# Patient Record
Sex: Male | Born: 1935
Health system: Southern US, Community
[De-identification: ages and names within clinical notes are randomized; demographics above are authoritative.]

## PROBLEM LIST (undated history)

## (undated) DIAGNOSIS — C801 Malignant (primary) neoplasm, unspecified: Secondary | ICD-10-CM

## (undated) DIAGNOSIS — F0390 Unspecified dementia without behavioral disturbance: Secondary | ICD-10-CM

## (undated) DIAGNOSIS — N189 Chronic kidney disease, unspecified: Secondary | ICD-10-CM

## (undated) DIAGNOSIS — R296 Repeated falls: Secondary | ICD-10-CM

## (undated) DIAGNOSIS — R002 Palpitations: Secondary | ICD-10-CM

## (undated) DIAGNOSIS — I4891 Unspecified atrial fibrillation: Secondary | ICD-10-CM

## (undated) DIAGNOSIS — I10 Essential (primary) hypertension: Secondary | ICD-10-CM

## (undated) DIAGNOSIS — J209 Acute bronchitis, unspecified: Secondary | ICD-10-CM

## (undated) HISTORY — PX: CAROTID BODY TUMOR EXCISION: SHX5156

## (undated) HISTORY — PX: KNEE SURGERY: SHX244

## (undated) HISTORY — DX: Unspecified dementia, unspecified severity, without behavioral disturbance, psychotic disturbance, mood disturbance, and anxiety: F03.90

## (undated) HISTORY — DX: Acute bronchitis, unspecified: J20.9

## (undated) HISTORY — DX: Repeated falls: R29.6

## (undated) HISTORY — DX: Unspecified atrial fibrillation: I48.91

## (undated) HISTORY — PX: RECURRENT HERNIA: SHX6046

## (undated) HISTORY — DX: Chronic kidney disease, unspecified: N18.9

## (undated) HISTORY — DX: Palpitations: R00.2

---

## 2015-11-08 ENCOUNTER — Ambulatory Visit: Payer: Self-pay | Admitting: Adult Health

## 2015-12-09 ENCOUNTER — Encounter: Payer: Self-pay | Admitting: Cardiology

## 2015-12-11 ENCOUNTER — Institutional Professional Consult (permissible substitution): Payer: Self-pay | Admitting: Cardiology

## 2015-12-24 ENCOUNTER — Institutional Professional Consult (permissible substitution): Payer: Self-pay | Admitting: Cardiology

## 2015-12-24 NOTE — Progress Notes (Deleted)
Electrophysiology Office Note   Date:  12/24/2015   ID:  Jesse Benjamin, DOB [birth year], MRN [id number]  PCP:  No primary care provider on file.  Cardiologist:  *** Primary Electrophysiologist: *** Kemontae Dunklee Meredith Leeds, MD    No chief complaint on file.    History of Present Illness: Jesse Benjamin is a 82 y.o. male who presents today for electrophysiology evaluation.   ***   Today, he denies*** symptoms of palpitations, chest pain, shortness of breath, orthopnea, PND, lower extremity edema, claudication, dizziness, presyncope, syncope, bleeding, or neurologic sequela. The patient is tolerating medications without difficulties and is otherwise without complaint today.    Past Medical History:  Diagnosis Date  . Atrial fibrillation ([place])   . Bronchitis, acute   . CKD (chronic kidney disease)   . Dementia   . Frequent falls   . Palpitations    Past Surgical History:  Procedure Laterality Date  . CAROTID BODY TUMOR EXCISION    . KNEE SURGERY Right   . RECURRENT HERNIA     x 2     Current Outpatient Prescriptions  Medication Sig Dispense Refill  . lisinopril-hydrochlorothiazide (PRINZIDE,ZESTORETIC) 20-25 MG tablet Take 1 tablet by mouth daily.    Marland Kitchen omeprazole (PRILOSEC) 20 MG capsule Take 20 mg by mouth daily.    Marland Kitchen PARoxetine (PAXIL) 10 MG tablet Take 10 mg by mouth daily.    . pravastatin (PRAVACHOL) 10 MG tablet Take 10 mg by mouth daily.     No current facility-administered medications for this visit.     Allergies:   Review of patient's allergies indicates no known allergies.   Social History:  The patient  reports that he has never smoked. He has never used smokeless tobacco. He reports that he does not drink alcohol or use drugs.   Family History:  The patient's ***family history is not on file.    ROS:  Please see the history of present illness.   Otherwise, review of systems is positive for ***.   All other systems are reviewed and negative.    PHYSICAL  EXAM: VS:  There were no vitals taken for this visit. , BMI There is no height or weight on file to calculate BMI. GEN: Well nourished, well developed, in no acute distress HEENT: normal Neck: no JVD, carotid bruits, or masses Cardiac: ***RRR; no murmurs, rubs, or gallops,no edema  Respiratory:  clear to auscultation bilaterally, normal work of breathing GI: soft, nontender, nondistended, + BS MS: no deformity or atrophy Skin: warm and dry, *** device pocket is well healed Neuro:  Strength and sensation are intact Psych: euthymic mood, full affect  EKG:  EKG {ACTION; IS/IS GI:[id number] ordered today. Personal review of the ekg ordered shows ***  *** Device interrogation is reviewed today in detail.  See PaceArt for details.   Recent Labs: No results found for requested labs within last 8760 hours.    Lipid Panel  No results found for: CHOL, TRIG, HDL, CHOLHDL, VLDL, LDLCALC, LDLDIRECT   Wt Readings from Last 3 Encounters:  No data found for Wt      Other studies Reviewed: Additional studies/ records that were reviewed today include: ***  Review of the above records today demonstrates: ***   ASSESSMENT AND PLAN:  1.  ***    Current medicines are reviewed at length with the patient today.   The patient {ACTIONS; HAS/DOES NOT HAVE:19233} concerns regarding his medicines.  The following changes were made today:  {NONE DEFAULTED:18576::"none"}  Labs/  tests ordered today include: *** No orders of the defined types were placed in this encounter.    Disposition:   FU with *** {gen number [id number] {Days to years:10300}  Signed, Leeland Lovelady Meredith Leeds, MD  12/24/2015 7:44 AM     [facility] [address] [place] [address] [phone] (office) [phone] (fax)

## 2016-01-20 ENCOUNTER — Emergency Department (HOSPITAL_COMMUNITY): Payer: Medicare Other

## 2016-01-20 ENCOUNTER — Encounter (HOSPITAL_COMMUNITY): Payer: Self-pay

## 2016-01-20 ENCOUNTER — Emergency Department (HOSPITAL_COMMUNITY)
Admission: EM | Admit: 2016-01-20 | Discharge: 2016-01-20 | Disposition: A | Discharge status: Home / Self Care | Payer: Medicare Other | Attending: Emergency Medicine | Admitting: Emergency Medicine

## 2016-01-20 DIAGNOSIS — S199XXA Unspecified injury of neck, initial encounter: Secondary | ICD-10-CM | POA: Diagnosis not present

## 2016-01-20 DIAGNOSIS — S0031XA Abrasion of nose, initial encounter: Secondary | ICD-10-CM | POA: Diagnosis not present

## 2016-01-20 DIAGNOSIS — Y92009 Unspecified place in unspecified non-institutional (private) residence as the place of occurrence of the external cause: Secondary | ICD-10-CM | POA: Insufficient documentation

## 2016-01-20 DIAGNOSIS — I129 Hypertensive chronic kidney disease with stage 1 through stage 4 chronic kidney disease, or unspecified chronic kidney disease: Secondary | ICD-10-CM | POA: Diagnosis not present

## 2016-01-20 DIAGNOSIS — N189 Chronic kidney disease, unspecified: Secondary | ICD-10-CM | POA: Diagnosis not present

## 2016-01-20 DIAGNOSIS — T07XXXA Unspecified multiple injuries, initial encounter: Secondary | ICD-10-CM

## 2016-01-20 DIAGNOSIS — S0083XA Contusion of other part of head, initial encounter: Secondary | ICD-10-CM | POA: Diagnosis not present

## 2016-01-20 DIAGNOSIS — M5030 Other cervical disc degeneration, unspecified cervical region: Secondary | ICD-10-CM | POA: Diagnosis not present

## 2016-01-20 DIAGNOSIS — Z79899 Other long term (current) drug therapy: Secondary | ICD-10-CM | POA: Diagnosis not present

## 2016-01-20 DIAGNOSIS — Y990 Civilian activity done for income or pay: Secondary | ICD-10-CM | POA: Insufficient documentation

## 2016-01-20 DIAGNOSIS — Z859 Personal history of malignant neoplasm, unspecified: Secondary | ICD-10-CM | POA: Diagnosis not present

## 2016-01-20 DIAGNOSIS — S0990XA Unspecified injury of head, initial encounter: Secondary | ICD-10-CM

## 2016-01-20 DIAGNOSIS — Y9389 Activity, other specified: Secondary | ICD-10-CM | POA: Insufficient documentation

## 2016-01-20 DIAGNOSIS — W1809XA Striking against other object with subsequent fall, initial encounter: Secondary | ICD-10-CM | POA: Diagnosis not present

## 2016-01-20 DIAGNOSIS — W19XXXA Unspecified fall, initial encounter: Secondary | ICD-10-CM

## 2016-01-20 DIAGNOSIS — M47812 Spondylosis without myelopathy or radiculopathy, cervical region: Secondary | ICD-10-CM

## 2016-01-20 HISTORY — DX: Essential (primary) hypertension: I10

## 2016-01-20 HISTORY — DX: Malignant (primary) neoplasm, unspecified: C80.1

## 2016-01-20 LAB — BASIC METABOLIC PANEL
Anion gap: 8 (ref 5–15)
BUN: 23 mg/dL — AB (ref 6–20)
CALCIUM: 9.5 mg/dL (ref 8.9–10.3)
CO2: 27 mmol/L (ref 22–32)
CREATININE: 1.38 mg/dL — AB (ref 0.61–1.24)
Chloride: 102 mmol/L (ref 101–111)
GFR calc Af Amer: 49 mL/min — ABNORMAL LOW (ref >60)
GFR calc non Af Amer: 42 mL/min — ABNORMAL LOW (ref >60)
GLUCOSE: 143 mg/dL — AB (ref 65–99)
Potassium: 4.3 mmol/L (ref 3.5–5.1)
Sodium: 137 mmol/L (ref 135–145)

## 2016-01-20 LAB — URINALYSIS, ROUTINE W REFLEX MICROSCOPIC
BILIRUBIN URINE: NEGATIVE
Glucose, UA: NEGATIVE mg/dL
HGB URINE DIPSTICK: NEGATIVE
KETONES UR: NEGATIVE mg/dL
Leukocytes, UA: NEGATIVE
NITRITE: NEGATIVE
PROTEIN: NEGATIVE mg/dL
Specific Gravity, Urine: 1.02 (ref 1.005–1.030)
pH: 5.5 (ref 5.0–8.0)

## 2016-01-20 LAB — CBC WITH DIFFERENTIAL/PLATELET
BASOS PCT: 1 %
Basophils Absolute: 0.1 10*3/uL (ref 0.0–0.1)
EOS ABS: 0.2 10*3/uL (ref 0.0–0.7)
Eosinophils Relative: 1 %
HEMATOCRIT: 40.6 % (ref 39.0–52.0)
Hemoglobin: 13.7 g/dL (ref 13.0–17.0)
Lymphocytes Relative: 15 %
Lymphs Abs: 2 10*3/uL (ref 0.7–4.0)
MCH: 33.5 pg (ref 26.0–34.0)
MCHC: 33.7 g/dL (ref 30.0–36.0)
MCV: 99.3 fL (ref 78.0–100.0)
MONO ABS: 1.7 10*3/uL — AB (ref 0.1–1.0)
MONOS PCT: 12 %
NEUTROS ABS: 10 10*3/uL — AB (ref 1.7–7.7)
Neutrophils Relative %: 71 %
PLATELETS: 207 10*3/uL (ref 150–400)
RBC: 4.09 MIL/uL — ABNORMAL LOW (ref 4.22–5.81)
RDW: 14.3 % (ref 11.5–15.5)
WBC: 14 10*3/uL — ABNORMAL HIGH (ref 4.0–10.5)

## 2016-01-20 NOTE — Progress Notes (Signed)
Referral to [facility] sent via email

## 2016-01-20 NOTE — ED Notes (Signed)
C-COLLAR APPLIED BY ANNIEAH NT AND WRITER. EDP WENTZ AWARE. PT TOLERATED

## 2016-01-20 NOTE — ED Notes (Signed)
Patient transported to CT 

## 2016-01-20 NOTE — ED Provider Notes (Signed)
[place] DEPT Provider Note   CSN: [id number] Arrival date & time: 01/20/16  1601     History   Chief Complaint Chief Complaint  Patient presents with  . Fall  . Neck Pain  . Shoulder Pain    HPI Rahmel Hasbrouck is a 82 y.o. male.  He was walking up some steps and fell forward striking his nose and injuring his neck. Bystanders assisted him, to stand. He presents by EMS, without immobilization. He does not believe that he was knocked out. He was unable to get up afterwards, on his own. According to his daughters were here with him. He has had multiple falls in the last couple of years, but none recently. He lives with one of his daughters. There've been no recent illnesses such as fevers, vomiting, diarrhea, cough or chest pain. Patient does not complain of any other problems. He was recently diagnosed with atrial fibrillation, but no active treatment is planned. He is not on anticoagulants. There are no other known modifying factors.  HPI  Past Medical History:  Diagnosis Date  . Atrial fibrillation ([place])   . Bronchitis, acute   . Cancer ([place])   . CKD (chronic kidney disease)   . Dementia   . Frequent falls   . Hypertension   . Palpitations     There are no active problems to display for this patient.   Past Surgical History:  Procedure Laterality Date  . CAROTID BODY TUMOR EXCISION    . KNEE SURGERY Right   . RECURRENT HERNIA     x 2       Home Medications    Prior to Admission medications   Medication Sig Start Date End Date Taking? Authorizing Provider  lisinopril-hydrochlorothiazide (PRINZIDE,ZESTORETIC) 20-25 MG tablet Take 1 tablet by mouth daily.   Yes Historical Provider, MD  Multiple Vitamin (MULTIVITAMIN WITH MINERALS) TABS tablet Take 1 tablet by mouth daily.   Yes Historical Provider, MD  omeprazole (PRILOSEC) 20 MG capsule Take 20 mg by mouth daily.   Yes Historical Provider, MD  PARoxetine (PAXIL) 10 MG tablet Take 10 mg by mouth daily.   Yes  Historical Provider, MD  pravastatin (PRAVACHOL) 10 MG tablet Take 10 mg by mouth daily.   Yes Historical Provider, MD    Family History No family history on file.  Social History Social History  Substance Use Topics  . Smoking status: Never Smoker  . Smokeless tobacco: Never Used  . Alcohol use No     Allergies   Review of patient's allergies indicates no known allergies.   Review of Systems Review of Systems  All other systems reviewed and are negative.    Physical Exam Updated Vital Signs BP 140/91 (BP Location: Right Arm)   Pulse 105   Resp 20   SpO2 96%   Physical Exam  Constitutional: He is oriented to person, place, and time. He appears well-developed and well-nourished.  Elderly, frail  HENT:  Head: Normocephalic and atraumatic.  Right Ear: External ear normal.  Left Ear: External ear normal.  Small abrasion bridge of nose, no associated swelling or deformity. Mild swelling left upper lip, without associated laceration, abrasion or dental injury.  Eyes: Conjunctivae and EOM are normal. Pupils are equal, round, and reactive to light.  Neck: Normal range of motion and phonation normal. Neck supple.  Cardiovascular: Normal rate, regular rhythm and normal heart sounds.   Pulmonary/Chest: Effort normal and breath sounds normal. He exhibits no bony tenderness.  Abdominal: Soft. There  is no tenderness.  Musculoskeletal: Normal range of motion.  She resists flexion of the neck secondary to pain. There is no bruising or swelling of the anterior neck. There is no tenderness with palpation of the cervical, thoracic or lumbar spines. Normal range of motion, arms and legs bilaterally.  Neurological: He is alert and oriented to person, place, and time. No cranial nerve deficit or sensory deficit. He exhibits normal muscle tone. Coordination normal.  Skin: Skin is warm, dry and intact.  Psychiatric: He has a normal mood and affect. His behavior is normal.  Nursing note and  vitals reviewed.    ED Treatments / Results  Labs (all labs ordered are listed, but only abnormal results are displayed) Labs Reviewed  BASIC METABOLIC PANEL - Abnormal; Notable for the following:       Result Value   Glucose, Bld 143 (*)    BUN 23 (*)    Creatinine, Ser 1.38 (*)    GFR calc non Af Amer 42 (*)    GFR calc Af Amer 49 (*)    All other components within normal limits  CBC WITH DIFFERENTIAL/PLATELET - Abnormal; Notable for the following:    WBC 14.0 (*)    RBC 4.09 (*)    Neutro Abs 10.0 (*)    Monocytes Absolute 1.7 (*)    All other components within normal limits  URINALYSIS, ROUTINE W REFLEX MICROSCOPIC (NOT AT [facility])    EKG  EKG Interpretation  Date/Time:  Monday January 20 2016 16:51:30 EDT Ventricular Rate:  90 PR Interval:    QRS Duration: 89 QT Interval:  396 QTC Calculation: 485 R Axis:   61 Text Interpretation:  Atrial fibrillation Ventricular bigeminy Anterior infarct, old No old tracing to compare Confirmed by [facility]  MD, Hargis Vandyne [phone]) on 01/20/2016 6:38:40 PM       Radiology Dg Chest 2 View  Result Date: 01/20/2016 CLINICAL DATA:  Patient found down today. EXAM: CHEST  2 VIEW COMPARISON:  None. FINDINGS: Subsegmental atelectasis is seen in the left lung base. Lungs are otherwise clear. There is cardiomegaly. Aortic atherosclerosis is noted. No pneumothorax or effusion. No focal bony abnormality. IMPRESSION: Cardiomegaly without acute disease. Atherosclerosis. Electronically Signed   By: Inge Rise M.D.   On: 01/20/2016 17:38   Ct Head Wo Contrast  Result Date: 01/20/2016 CLINICAL DATA:  82 year old male with unwitnessed fall. Left side pain. Initial encounter. EXAM: CT HEAD WITHOUT CONTRAST CT CERVICAL SPINE WITHOUT CONTRAST TECHNIQUE: Multidetector CT imaging of the head and cervical spine was performed following the standard protocol without intravenous contrast. Multiplanar CT image reconstructions of the cervical spine were also  generated. COMPARISON:  None. FINDINGS: CT HEAD FINDINGS Brain: No midline shift, ventriculomegaly, mass effect, evidence of mass lesion, intracranial hemorrhage or evidence of cortically based acute infarction. Small area of left inferior frontal gyrus encephalomalacia, likely the sequelae of remote trauma. No other cortical encephalomalacia, and elsewhere gray-white matter differentiation is normal for age. Vascular: Calcified atherosclerosis at the skull base. No suspicious intracranial vascular hyperdensity. Skull: Calvarium intact. Sinuses/Orbits: Clear. Other: Mild left forehead scalp hematoma (series 4, image 18). No other acute scalp or orbits soft tissue findings. CT CERVICAL SPINE FINDINGS Alignment: Multilevel mild spondylolisthesis, including anterolisthesis of C4 on C5 and C5 on C6. Straightening and mild reversal of cervical lordosis. Cervicothoracic junction alignment is within normal limits. Bilateral posterior element alignment is within normal limits. Skull base and vertebrae: Visualized skull base is intact. No atlanto-occipital dissociation. No acute cervical spine  fracture identified. Soft tissues and spinal canal: No prevertebral fluid or swelling. No visible canal hematoma. Calcified carotid atherosclerosis. Otherwise negative noncontrast neck soft tissues. Disc levels: Moderate to severe cervical facet degeneration C3-C4 and C4-C5. Advanced chronic disc and endplate degeneration D34-534 and C6-C7. No significant spinal stenosis suspected. Upper chest: Negative lung apices. Chronic appearing T2 inferior endplate deformity. Other visible upper thoracic levels appear intact. Other: None. IMPRESSION: 1. Left forehead scalp hematoma without underlying fracture. 2. No acute traumatic injury to the brain is identified. Mild chronic left inferior frontal gyrus encephalomalacia is likely the sequelae of remote trauma. 3. Otherwise negative for age noncontrast CT appearance of the brain. 4. No acute  fracture identified in the cervical spine. Ligamentous injury is not excluded. 5. Multilevel degenerative appearing mild spondylolisthesis with advanced disc, endplate and facet degeneration. Electronically Signed   By: Genevie Ann M.D.   On: 01/20/2016 17:34   Ct Cervical Spine Wo Contrast  Result Date: 01/20/2016 CLINICAL DATA:  82 year old male with unwitnessed fall. Left side pain. Initial encounter. EXAM: CT HEAD WITHOUT CONTRAST CT CERVICAL SPINE WITHOUT CONTRAST TECHNIQUE: Multidetector CT imaging of the head and cervical spine was performed following the standard protocol without intravenous contrast. Multiplanar CT image reconstructions of the cervical spine were also generated. COMPARISON:  None. FINDINGS: CT HEAD FINDINGS Brain: No midline shift, ventriculomegaly, mass effect, evidence of mass lesion, intracranial hemorrhage or evidence of cortically based acute infarction. Small area of left inferior frontal gyrus encephalomalacia, likely the sequelae of remote trauma. No other cortical encephalomalacia, and elsewhere gray-white matter differentiation is normal for age. Vascular: Calcified atherosclerosis at the skull base. No suspicious intracranial vascular hyperdensity. Skull: Calvarium intact. Sinuses/Orbits: Clear. Other: Mild left forehead scalp hematoma (series 4, image 18). No other acute scalp or orbits soft tissue findings. CT CERVICAL SPINE FINDINGS Alignment: Multilevel mild spondylolisthesis, including anterolisthesis of C4 on C5 and C5 on C6. Straightening and mild reversal of cervical lordosis. Cervicothoracic junction alignment is within normal limits. Bilateral posterior element alignment is within normal limits. Skull base and vertebrae: Visualized skull base is intact. No atlanto-occipital dissociation. No acute cervical spine fracture identified. Soft tissues and spinal canal: No prevertebral fluid or swelling. No visible canal hematoma. Calcified carotid atherosclerosis. Otherwise  negative noncontrast neck soft tissues. Disc levels: Moderate to severe cervical facet degeneration C3-C4 and C4-C5. Advanced chronic disc and endplate degeneration D34-534 and C6-C7. No significant spinal stenosis suspected. Upper chest: Negative lung apices. Chronic appearing T2 inferior endplate deformity. Other visible upper thoracic levels appear intact. Other: None. IMPRESSION: 1. Left forehead scalp hematoma without underlying fracture. 2. No acute traumatic injury to the brain is identified. Mild chronic left inferior frontal gyrus encephalomalacia is likely the sequelae of remote trauma. 3. Otherwise negative for age noncontrast CT appearance of the brain. 4. No acute fracture identified in the cervical spine. Ligamentous injury is not excluded. 5. Multilevel degenerative appearing mild spondylolisthesis with advanced disc, endplate and facet degeneration. Electronically Signed   By: Genevie Ann M.D.   On: 01/20/2016 17:34    Procedures Procedures (including critical care time)  Medications Ordered in ED Medications - No data to display   Initial Impression / Assessment and Plan / ED Course  I have reviewed the triage vital signs and the nursing notes.  Pertinent labs & imaging results that were available during my care of the patient were reviewed by me and considered in my medical decision making (see chart for details).  Clinical Course  Medications - No data to display  Patient Vitals for the past 24 hrs:  BP Pulse Resp SpO2  01/20/16 1845 140/91 105 20 96 %  01/20/16 1612 - - - 97 %   Patient was seen in the ED, by both case manager, and Education officer, museum. They gave resources to the family members to assist with additional care and considerations for placement.  7:39 PM Reevaluation with update and discussion. After initial assessment and treatment, an updated evaluation reveals He remains alert and comfortable. He was able to ambulate easily, and stated to be normal, by his  daughter. Findings discussed with patient and family members, all questions were answered. Ersie Savino L    Final Clinical Impressions(s) / ED Diagnoses   Final diagnoses:  Fall, initial encounter  Injury of head, initial encounter  Injury of neck, initial encounter  DJD (degenerative joint disease), cervical  Abrasion of nose, initial encounter  Contusion, multiple sites    Fall, likely mechanical, with negative screening evaluation, for causes of falling. CT his head and neck, negative for serious injury. Incidental osteoarthritis, family members informed.  Nursing Notes Reviewed/ Care Coordinated Applicable Imaging Reviewed Interpretation of Laboratory Data incorporated into ED treatment  The patient appears reasonably screened and/or stabilized for discharge and I doubt any other medical condition or other [facility] requiring further screening, evaluation, or treatment in the ED at this time prior to discharge.  Plan: Home Medications- APAP for pain, continue usual; Home Treatments- rest, wound care, ice/heat; return here if the recommended treatment, does not improve the symptoms; Recommended follow up- PCP 1 week. Consider further w/u, PT, placement    New Prescriptions New Prescriptions   No medications on file     Daleen Bo, MD 01/20/16 1942

## 2016-01-20 NOTE — ED Triage Notes (Signed)
Per [facility]- Pt resides at home- FULL CODE.Unwitnessed fall. Found by his neighbor. Pt able to recall fall. He states he lost his balance going down steps landing in a bush. Pt c/o anterior left sided neck pain and left sided clavicle pain. NO LOC. Denies N/V/D CP/SOB. Neuro intact. PEERL. No spinal support SCCA cleared

## 2016-01-20 NOTE — Discharge Instructions (Signed)
Keep the nose wound clean with soap and water once or twice a day.  Use ice on sore spots 3 times a day for 3 days. After that, use heat.  For pain, take Tylenol.

## 2016-01-20 NOTE — Progress Notes (Addendum)
Pcp is [facility]row Pt with 2 daughters Was living with the daughter with the single level home when he had his fall Pt without his hearing aid and with difficulty hearing Very Pleasant Pt gave Cm a hug  Daughter, Vaughan Basta, and son in law in the room was the family pt stayed with when he first moved to the area a few months ago. The daughter present in his room walked out of the room when the CM was leaving & discussed with ED CM that she feels he should be in a nursing home Reports she is working with realtors to sell the pt home. Pt has not informed CM he wanted to go to facility.  Daughter at bedside reports the death of pt wife "two years ago" and the death of a brother in [place] last year.   CM discussed [facility] connections as a resource for pt that the family could access to get options of disposition for this medicare

## 2016-01-20 NOTE — ED Notes (Signed)
ANNIEAH NT WALKED PT WITH FAMILY TO BATHROOM. AWARE OF NEED FOR URINE

## 2016-01-20 NOTE — ED Notes (Signed)
Pt. Unable to urinate at this time. Nurse aware. 

## 2016-01-20 NOTE — Progress Notes (Addendum)
CM reviewed in details medicare guidelines, Choices of home health [facility]) (length of stay in home, types of [facility] staff available, coverage, primary caregiver, up to 24 hrs before services may be started) and choices of Private duty nursing (PDN-coverage, length of stay in the home types of staff available). CM reviewed availability of [facility] SW to assist pcp to get pt to snf (if desired disposition) from the community level. CM provided pt/family with a copy of choice connections, a list of [place] home health agencies and PDN. Encouraged use of choice connections Updated EDP, Eulis Foster who reviewed clinicals -no reason for admission identified   Discussed assisted living (ASL- coverage, services offered) and Skilled nursing facilities (snf- coverage and services offered)  Cm spoke with ED SW to see if Vaughan Basta could be given a list of ALFs and SNFs Pt with walker, cane, crutch at daughters home stayed with Vaughan Basta and husband for 2 months and now with other sister for 2 months Pt last fall was in April 2017 per Vaughan Basta but with noted frequent falls Vaughan Basta also reports pt had HHPT, RN and sitter in [place] but eventually would not allow staff in to visit him.   Pt with a hx of dementia and Vaughan Basta had informed pt they would find him an apartment CM encouraged a conversation about facility placement vs apartment Discussed neurology evaluation  ED CM left her office number with Vaughan Basta

## 2016-01-20 NOTE — ED Notes (Signed)
Bed: [id number] Expected date:  Expected time:  Means of arrival:  Comments: 82 yo M, Fall

## 2016-01-20 NOTE — Progress Notes (Signed)
CSW provided patients daughter, Vaughan Basta, with list of skilled nursing facility list and assisted living facility list.   Kingsley Spittle, [facility] Clinical Social Worker [phone]

## 2016-01-21 ENCOUNTER — Telehealth: Payer: Self-pay | Admitting: *Deleted

## 2016-10-28 ENCOUNTER — Ambulatory Visit (INDEPENDENT_AMBULATORY_CARE_PROVIDER_SITE_OTHER): Payer: Medicare Other | Admitting: Podiatry

## 2016-10-28 ENCOUNTER — Encounter: Payer: Self-pay | Admitting: Podiatry

## 2016-10-28 DIAGNOSIS — B351 Tinea unguium: Secondary | ICD-10-CM

## 2016-10-28 DIAGNOSIS — M79676 Pain in unspecified toe(s): Secondary | ICD-10-CM | POA: Diagnosis not present

## 2016-10-28 NOTE — Progress Notes (Signed)
° °  Subjective:    Patient ID: Jesse Benjamin, male    DOB: April 29, 1920, 83 y.o.   MRN: [id number]  HPI    Review of Systems  HENT: Positive for hearing loss.        Wears hearing aids  Cardiovascular: Positive for leg swelling.       Feet  Endocrine: Positive for polyuria.  Genitourinary: Positive for frequency and urgency.  Musculoskeletal: Positive for gait problem.  All other systems reviewed and are negative.      Objective:   Physical Exam        Assessment & Plan:

## 2016-10-30 NOTE — Progress Notes (Signed)
Patient ID: Jesse Benjamin, male   DOB: November 24, 1920, 83 y.o.   MRN: [id number]   SUBJECTIVE Patient  presents to office today complaining of elongated, thickened nails. Pain while ambulating in shoes. Patient is unable to trim their own nails.   OBJECTIVE General Patient is awake, alert, and oriented x 3 and in no acute distress. Derm Skin is dry and supple bilateral. Negative open lesions or macerations. Remaining integument unremarkable. Nails are tender, long, thickened and dystrophic with subungual debris, consistent with onychomycosis, 1-5 bilateral. No signs of infection noted. Vasc  DP and PT pedal pulses palpable bilaterally. Temperature gradient within normal limits.  Neuro Epicritic and protective threshold sensation diminished bilaterally.  Musculoskeletal Exam No symptomatic pedal deformities noted bilateral. Muscular strength within normal limits.  ASSESSMENT 1. Onychodystrophic nails 1-5 bilateral with hyperkeratosis of nails.  2. Onychomycosis of nail due to dermatophyte bilateral 3. Pain in foot bilateral  PLAN OF CARE 1. Patient evaluated today.  2. Instructed to maintain good pedal hygiene and foot care.  3. Mechanical debridement of nails 1-5 bilaterally performed using a nail nipper. Filed with dremel without incident.  4. Return to clinic in 3 mos.    Edrick Kins, DPM [facility]  Dr. Edrick Kins, [place]                                        [place], [place] [address]                Office [phone]  Fax [phone]

## 2017-10-26 IMAGING — CR DG CHEST 2V
2 series · 2 of 2 positions shown · non-contrast
Comparison: None.

CLINICAL DATA: Patient found down today.

EXAM:
CHEST  2 VIEW

[w chest lat]
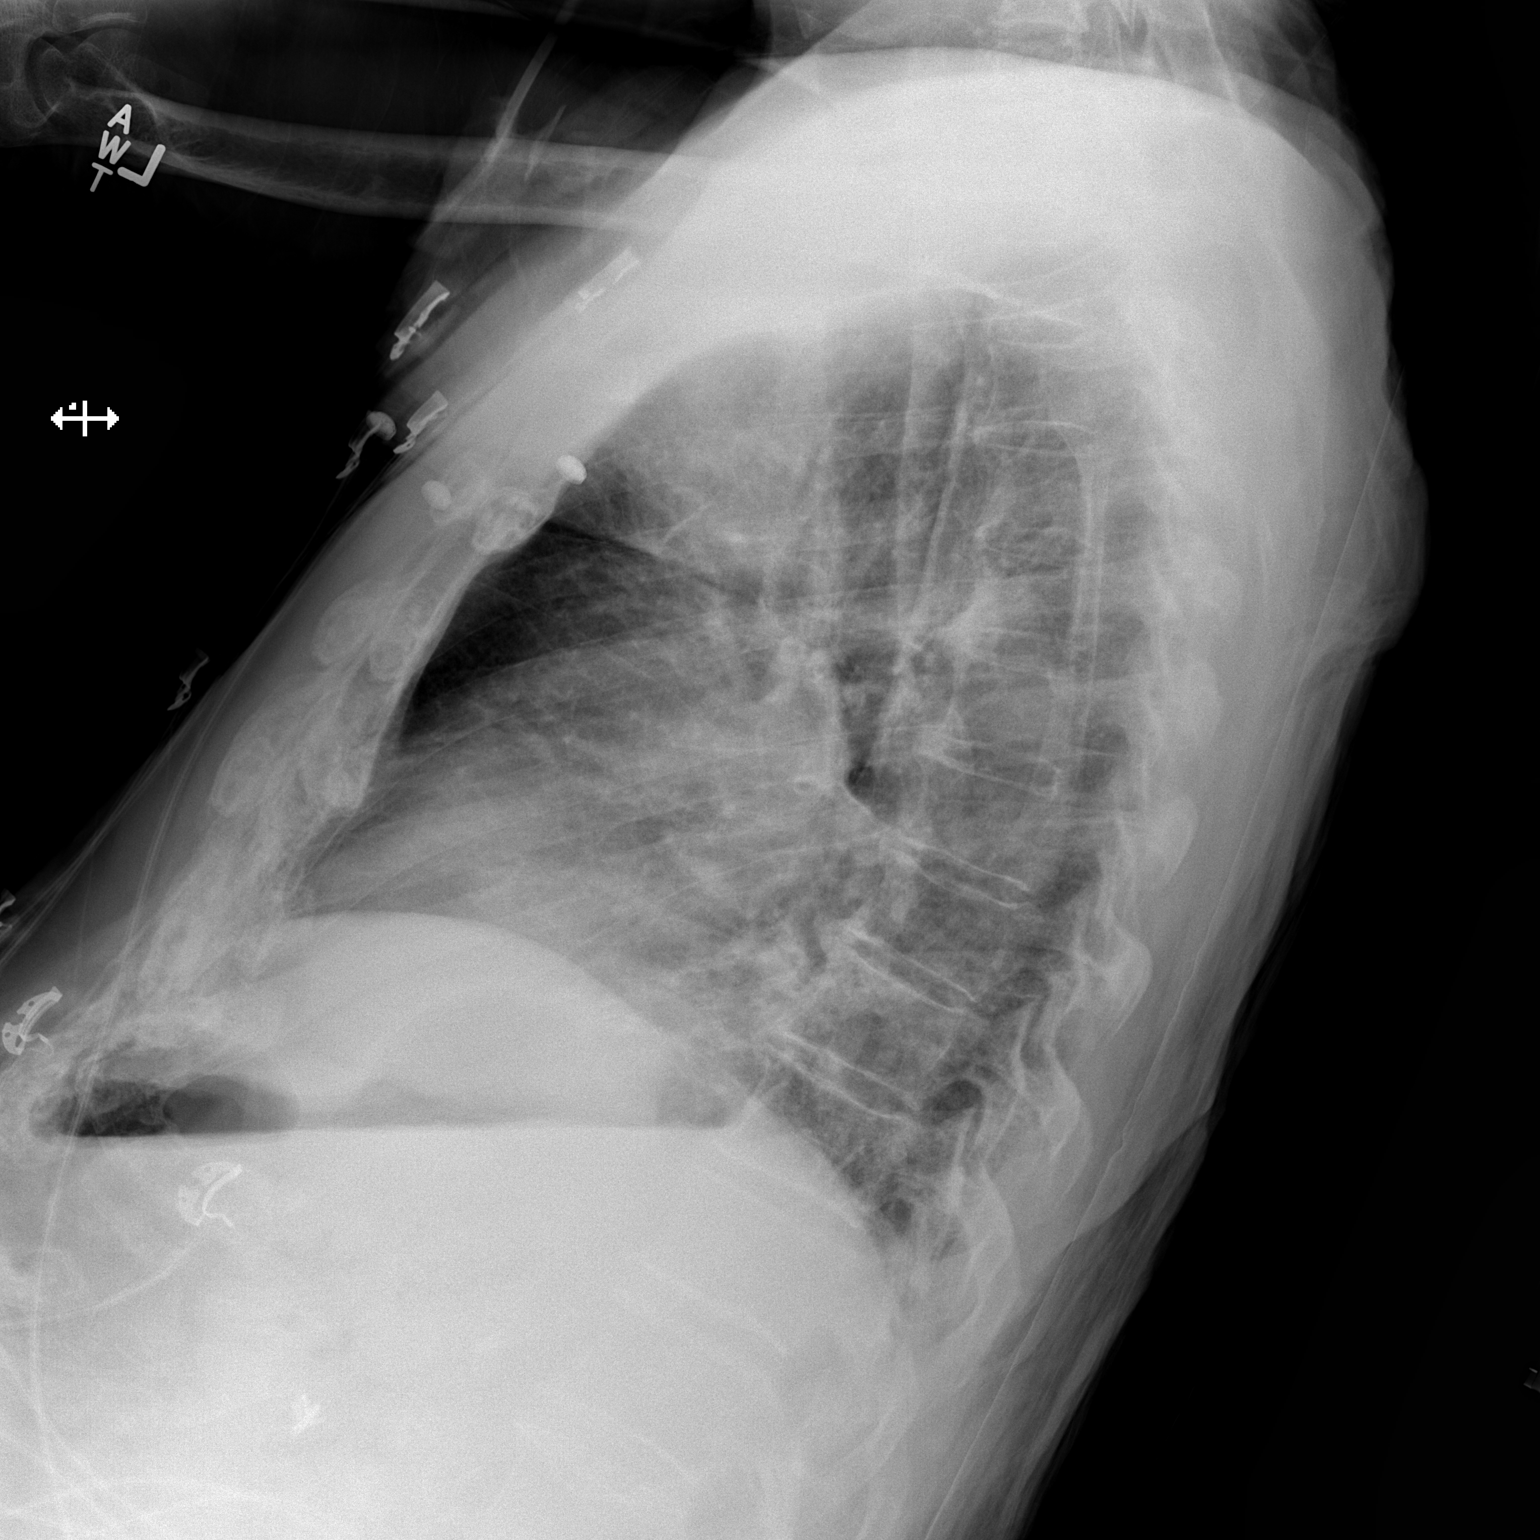

[x chest ap]
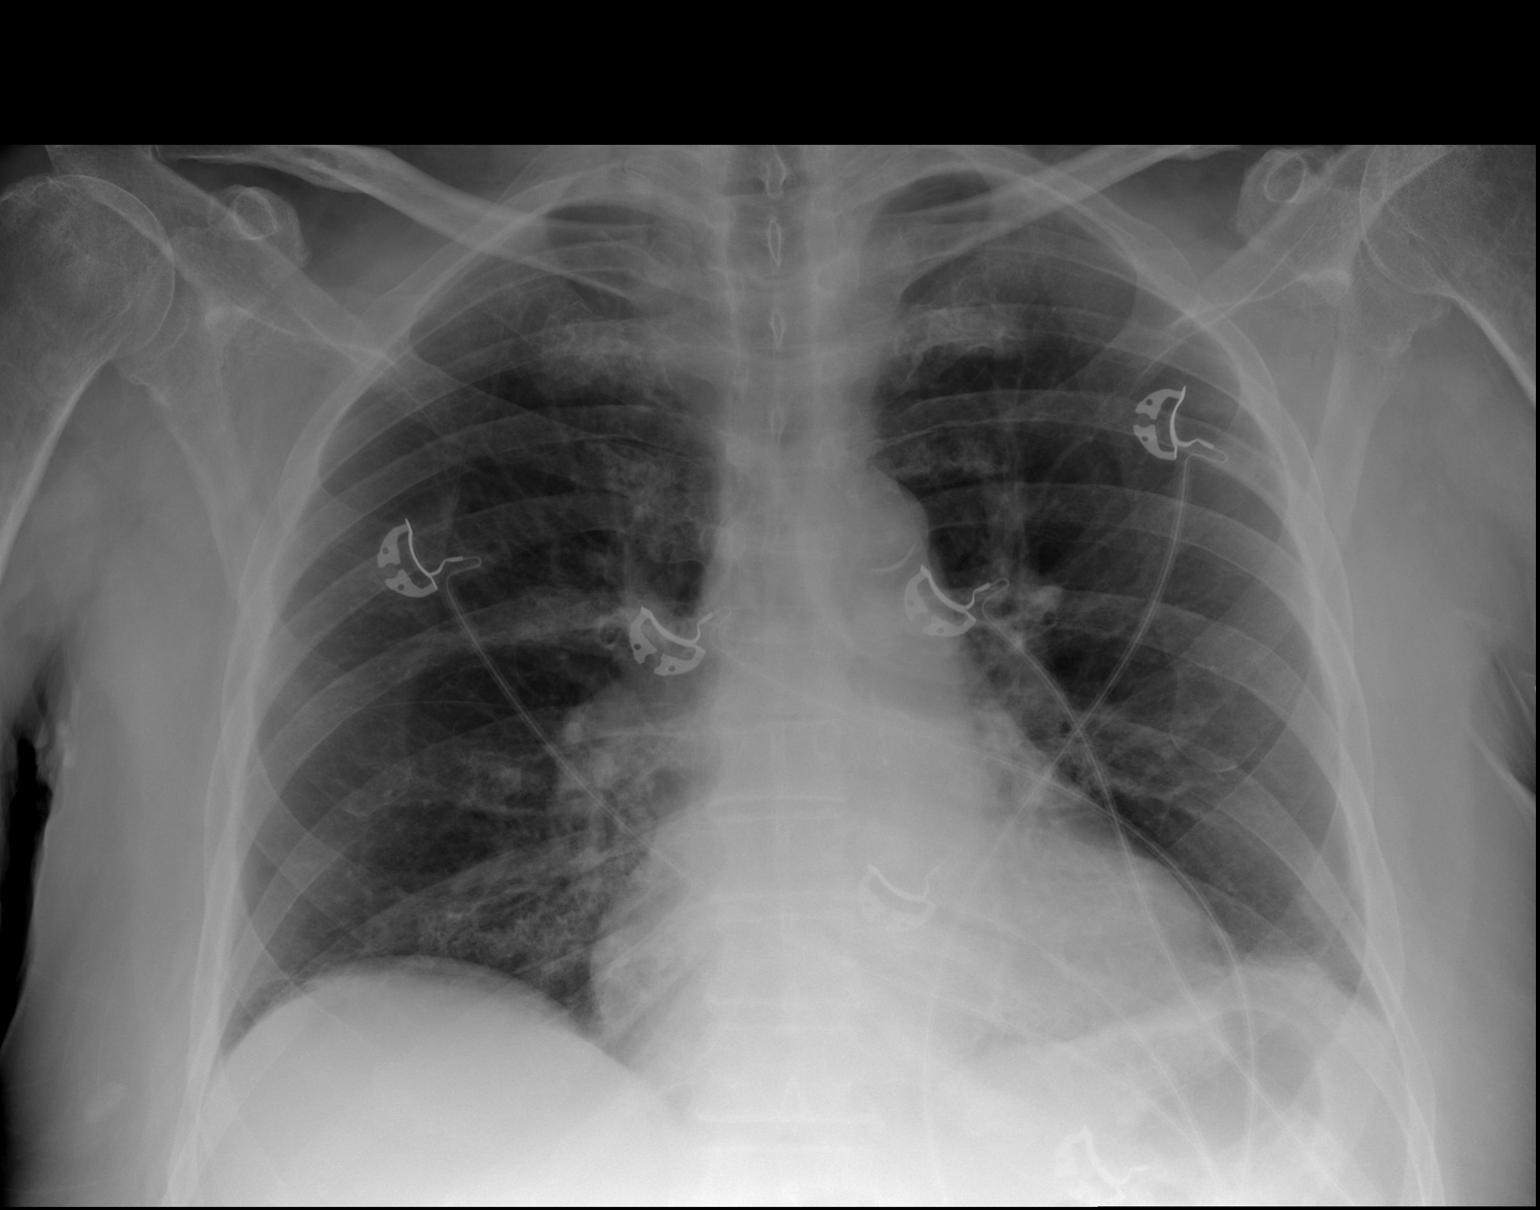

[2 of 2 positions shown; findings below may reference images not displayed]

FINDINGS: Subsegmental atelectasis is seen in the left lung base. Lungs are
otherwise clear. There is cardiomegaly. Aortic atherosclerosis is
noted. No pneumothorax or effusion. No focal bony abnormality.
IMPRESSION: Cardiomegaly without acute disease.

Atherosclerosis.

## 2020-04-30 ENCOUNTER — Ambulatory Visit: Payer: Medicare Other | Admitting: Dermatology

## 2021-02-10 NOTE — Progress Notes (Signed)
Oncology Nurse Navigator Documentation   I received notice that Jesse Benjamin daughter, Jesse Benjamin called to cancel his consult appointment with Dr. Isidore Moos tomorrow. 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 She told me that it was due to his age and they were told by his Dermatologist that is was a slower growing cancer. I will notify Dr. Margit Banda office and have told Jesse Benjamin that if they reconsider to please call me and she verbalized her understanding.   Harlow Asa RN, BSN, OCN Head & Neck Oncology Nurse [place] at [facility] Phone # [phone]  Fax # [phone]

## 2021-02-10 NOTE — Progress Notes (Incomplete)
Radiation Oncology         [phone] ________________________________  Initial Outpatient Consultation  Name: Jesse Benjamin MRN: [id number]  Date: 02/11/2021  DOB: 03-21-1921  CC:Jesse Pepper, MD  Lajuana Ripple, MD   REFERRING PHYSICIAN: Lajuana Ripple, MD  DIAGNOSIS: No diagnosis found.  Cancer Staging No matching staging information was found for the patient.  Lesion of the right ear  CHIEF COMPLAINT: Here to discuss management of skin cancer  HISTORY OF PRESENT ILLNESS::Jesse Benjamin is a 87 y.o. male who presented with a lesion on his right ear to Dr. Anabel Bene on 01/22/21. The lesion was noted as bleeding and red on exam and moderate in severity. According to the patient, the lesion had been present for many years and has not been treated in the past. The patient has a history of skin cancer.   Biopsy was collected of the ear lesion during visit on 10/12; pathology is still pending ***.    PREVIOUS RADIATION THERAPY: {EXAM; YES/NO:19492::"No"}  PAST MEDICAL HISTORY:  has a past medical history of Atrial fibrillation ([place]), Bronchitis, acute, Cancer ([place]), CKD (chronic kidney disease), Dementia, Frequent falls, Hypertension, and Palpitations.    PAST SURGICAL HISTORY: Past Surgical History:  Procedure Laterality Date   CAROTID BODY TUMOR EXCISION     KNEE SURGERY Right    RECURRENT HERNIA     x 2    FAMILY HISTORY: family history is not on file.  SOCIAL HISTORY:  reports that he has never smoked. He has never used smokeless tobacco. He reports that he does not drink alcohol and does not use drugs.  ALLERGIES: Patient has no allergy information on record.  MEDICATIONS:  Current Outpatient Medications  Medication Sig Dispense Refill   lisinopril-hydrochlorothiazide (PRINZIDE,ZESTORETIC) 20-25 MG tablet Take 1 tablet by mouth daily.     METOPROLOL SUCCINATE ER PO Take by mouth.     Multiple Vitamin (MULTIVITAMIN WITH MINERALS) TABS tablet Take 1 tablet by mouth  daily.     omeprazole (PRILOSEC) 20 MG capsule Take 20 mg by mouth daily.     PARoxetine (PAXIL) 10 MG tablet Take 10 mg by mouth daily.     pravastatin (PRAVACHOL) 10 MG tablet Take 10 mg by mouth daily.     No current facility-administered medications for this encounter.    REVIEW OF SYSTEMS:  Notable for that above.   PHYSICAL EXAM:  vitals were not taken for this visit.   General: Alert and oriented, in no acute distress *** HEENT: Head is normocephalic. Extraocular movements are intact. Oropharynx is clear. Neck: Neck is supple, no palpable cervical or supraclavicular lymphadenopathy. Heart: Regular in rate and rhythm with no murmurs, rubs, or gallops. Chest: Clear to auscultation bilaterally, with no rhonchi, wheezes, or rales. Abdomen: Soft, nontender, nondistended, with no rigidity or guarding. Extremities: No cyanosis or edema. Lymphatics: see Neck Exam Skin: No concerning lesions. Musculoskeletal: symmetric strength and muscle tone throughout. Neurologic: Cranial nerves II through XII are grossly intact. No obvious focalities. Speech is fluent. Coordination is intact. Psychiatric: Judgment and insight are intact. Affect is appropriate.   ECOG = ***  0 - Asymptomatic (Fully active, able to carry on all predisease activities without restriction)  1 - Symptomatic but completely ambulatory (Restricted in physically strenuous activity but ambulatory and able to carry out work of a light or sedentary nature. For example, light housework, office work)  2 - Symptomatic, <50% in bed during the day (Ambulatory and capable of all self care but unable  to carry out any work activities. Up and about more than 50% of waking hours)  3 - Symptomatic, >50% in bed, but not bedbound (Capable of only limited self-care, confined to bed or chair 50% or more of waking hours)  4 - Bedbound (Completely disabled. Cannot carry on any self-care. Totally confined to bed or chair)  5 - Death   Eustace Pen  MM, Creech RH, Tormey DC, et al. [phone]). "Toxicity and response criteria of the [facility]". [place] Oncol. 5 (6): 649-55   LABORATORY DATA:  Lab Results  Component Value Date   WBC 14.0 (H) 01/20/2016   HGB 13.7 01/20/2016   HCT 40.6 01/20/2016   MCV 99.3 01/20/2016   PLT 207 01/20/2016   CMP     Component Value Date/Time   NA 137 01/20/2016 1726   K 4.3 01/20/2016 1726   CL 102 01/20/2016 1726   CO2 27 01/20/2016 1726   GLUCOSE 143 (H) 01/20/2016 1726   BUN 23 (H) 01/20/2016 1726   CREATININE 1.38 (H) 01/20/2016 1726   CALCIUM 9.5 01/20/2016 1726   GFRNONAA 42 (L) 01/20/2016 1726   GFRAA 49 (L) 01/20/2016 1726         RADIOGRAPHY: No results found.    IMPRESSION/PLAN:***    On date of service, in total, I spent *** minutes on this encounter. Patient was seen in person.   __________________________________________   Eppie Gibson, MD  This document serves as a record of services personally performed by Eppie Gibson, MD. It was created on her behalf by Roney Mans, a trained medical scribe. The creation of this record is based on the scribe's personal observations and the provider's statements to them. This document has been checked and approved by the attending provider.

## 2021-02-11 ENCOUNTER — Ambulatory Visit
Admission: RE | Admit: 2021-02-11 | Discharge: 2021-02-11 | Disposition: A | Discharge status: Home / Self Care | Payer: Medicare Other | Source: Ambulatory Visit | Attending: Radiation Oncology | Admitting: Radiation Oncology

## 2021-02-11 ENCOUNTER — Ambulatory Visit: Payer: Medicare Other

## 2021-06-16 ENCOUNTER — Emergency Department (HOSPITAL_BASED_OUTPATIENT_CLINIC_OR_DEPARTMENT_OTHER): Payer: Medicare Other | Admitting: Radiology

## 2021-06-16 ENCOUNTER — Emergency Department (HOSPITAL_BASED_OUTPATIENT_CLINIC_OR_DEPARTMENT_OTHER)
Admission: EM | Admit: 2021-06-16 | Discharge: 2021-06-16 | Disposition: A | Discharge status: Home / Self Care | Payer: Medicare Other | Attending: Emergency Medicine | Admitting: Emergency Medicine

## 2021-06-16 ENCOUNTER — Encounter (HOSPITAL_BASED_OUTPATIENT_CLINIC_OR_DEPARTMENT_OTHER): Payer: Self-pay

## 2021-06-16 ENCOUNTER — Emergency Department (HOSPITAL_BASED_OUTPATIENT_CLINIC_OR_DEPARTMENT_OTHER): Payer: Medicare Other

## 2021-06-16 ENCOUNTER — Other Ambulatory Visit: Payer: Self-pay

## 2021-06-16 DIAGNOSIS — I6789 Other cerebrovascular disease: Secondary | ICD-10-CM | POA: Diagnosis not present

## 2021-06-16 DIAGNOSIS — Z23 Encounter for immunization: Secondary | ICD-10-CM | POA: Insufficient documentation

## 2021-06-16 DIAGNOSIS — S51811A Laceration without foreign body of right forearm, initial encounter: Secondary | ICD-10-CM | POA: Diagnosis not present

## 2021-06-16 DIAGNOSIS — S59911A Unspecified injury of right forearm, initial encounter: Secondary | ICD-10-CM | POA: Diagnosis present

## 2021-06-16 DIAGNOSIS — W1811XA Fall from or off toilet without subsequent striking against object, initial encounter: Secondary | ICD-10-CM | POA: Diagnosis not present

## 2021-06-16 MED ORDER — CEFAZOLIN SODIUM-DEXTROSE 1-4 GM/50ML-% IV SOLN
1.0000 g | Freq: Once | INTRAVENOUS | Status: AC
  Administered 2021-06-16: 1 g via INTRAVENOUS
  Filled 2021-06-16: qty 50

## 2021-06-16 MED ORDER — LIDOCAINE-EPINEPHRINE 2 %-1:200000 IJ SOLN
20.0000 mL | Freq: Once | INTRAMUSCULAR | Status: AC
Start: 2021-06-16 — End: 2021-06-16
  Administered 2021-06-16: 20 mL
  Filled 2021-06-16: qty 20

## 2021-06-16 MED ORDER — TETANUS-DIPHTH-ACELL PERTUSSIS 5-2.5-18.5 LF-MCG/0.5 IM SUSY
0.5000 mL | PREFILLED_SYRINGE | Freq: Once | INTRAMUSCULAR | Status: AC
Start: 2021-06-16 — End: 2021-06-16
  Administered 2021-06-16: 0.5 mL via INTRAMUSCULAR
  Filled 2021-06-16: qty 0.5

## 2021-06-16 NOTE — ED Notes (Signed)
EMT-P provided AVS using Teachback Method. Patient verbalizes understanding of Discharge Instructions. Opportunity for Questioning and Answers were provided by EMT-P. Patient Discharged from ED.  ? ?

## 2021-06-16 NOTE — ED Provider Notes (Signed)
[facility] EMERGENCY DEPT Provider Note   CSN: [id number] Arrival date & time: 06/16/21  1228     History  Chief Complaint  Patient presents with   Lytle Michaels    Jesse Benjamin is a 88 y.o. male who presents to the Emergency Department with complaints of a fall onset PTA. Pt notes that he was attempting to get off the toiler when he slid off it causing him to hit his right forearm on the nearby vanity. Pt is right hand dominant. Hasn't tried any medications for his symptoms. Denies hitting his head, LOC, dizziness, lightheadedness, vision change. Denies anticoagulant use at this time.   The history is provided by the patient and a relative. No language interpreter was used.      Home Medications Prior to Admission medications   Medication Sig Start Date End Date Taking? Authorizing Provider  lisinopril-hydrochlorothiazide (PRINZIDE,ZESTORETIC) 20-25 MG tablet Take 1 tablet by mouth daily.    [provider]  METOPROLOL SUCCINATE ER PO Take by mouth.    [provider]  Multiple Vitamin (MULTIVITAMIN WITH MINERALS) TABS tablet Take 1 tablet by mouth daily.    [provider]  omeprazole (PRILOSEC) 20 MG capsule Take 20 mg by mouth daily.    [provider]  PARoxetine (PAXIL) 10 MG tablet Take 10 mg by mouth daily.    [provider]  pravastatin (PRAVACHOL) 10 MG tablet Take 10 mg by mouth daily.    [provider]      Allergies    Patient has no allergy information on record.    Review of Systems   Review of Systems  Eyes:  Negative for visual disturbance.  Musculoskeletal:  Positive for arthralgias. Negative for joint swelling.  Skin:  Positive for wound. Negative for color change.  Neurological:  Negative for dizziness, syncope, light-headedness and headaches.  All other systems reviewed and are negative.  Physical Exam Updated Vital Signs BP (!) 143/63 (BP Location: Left Arm)    Pulse 75    Temp 98 F (36.7  C)    Resp 16    Ht 6' (1.829 m)    Wt 90.7 kg    SpO2 99%    BMI 27.12 kg/m  Physical Exam Vitals and nursing note reviewed.  Constitutional:      General: He is not in acute distress.    Appearance: He is not diaphoretic.  HENT:     Head: Normocephalic and atraumatic.     Mouth/Throat:     Pharynx: No oropharyngeal exudate.  Eyes:     General: No scleral icterus.    Conjunctiva/sclera: Conjunctivae normal.  Cardiovascular:     Rate and Rhythm: Normal rate and regular rhythm.     Pulses: Normal pulses.     Heart sounds: Normal heart sounds.  Pulmonary:     Effort: Pulmonary effort is normal. No respiratory distress.     Breath sounds: Normal breath sounds. No wheezing.  Abdominal:     General: Bowel sounds are normal.     Palpations: Abdomen is soft. There is no mass.     Tenderness: There is no abdominal tenderness. There is no guarding or rebound.  Musculoskeletal:        General: Normal range of motion.     Cervical back: Normal range of motion and neck supple.     Comments: 7 cm laceration noted to lateral aspect of right forearm. Capillary refill <2 seconds. Finger to thumb opposition intact. Grip strength 5/5 bilaterally.  Strength and sensation intact to bilateral upper and lower extremities.  Skin:    General: Skin is warm and dry.     Findings: Laceration present.  Neurological:     Mental Status: He is alert.  Psychiatric:        Behavior: Behavior normal.      ED Results / Procedures / Treatments   Labs (all labs ordered are listed, but only abnormal results are displayed) Labs Reviewed - No data to display  EKG None  Radiology DG Forearm Right  Result Date: 06/16/2021 CLINICAL DATA:  Fall, right forearm laceration EXAM: RIGHT FOREARM - 2 VIEW COMPARISON:  None. FINDINGS: No fracture. No focal osseous lesions. No bony erosions. No radiopaque foreign bodies. No dislocation at the elbow or wrist on these views. IMPRESSION: No fracture.  No radiopaque  foreign body. Electronically Signed   By: Ilona Sorrel M.D.   On: 06/16/2021 15:02   CT Head Wo Contrast  Result Date: 06/16/2021 CLINICAL DATA:  Fall. EXAM: CT HEAD WITHOUT CONTRAST TECHNIQUE: Contiguous axial images were obtained from the base of the skull through the vertex without intravenous contrast. RADIATION DOSE REDUCTION: This exam was performed according to the departmental dose-optimization program which includes automated exposure control, adjustment of the mA and/or kV according to patient size and/or use of iterative reconstruction technique. COMPARISON:  01/20/2016 FINDINGS: Brain: Mild cerebral atrophy. Low-density in the subcortical and periventricular white matter is suggestive for chronic changes. No evidence for acute hemorrhage, mass lesion, midline shift, hydrocephalus or large infarct. Focal low-density in the right thalamus is suggestive for a lacune infarct and likely old. Vascular: No hyperdense vessel or unexpected calcification. Skull: Normal. Negative for fracture or focal lesion. Sinuses/Orbits: No acute finding. Other: None. IMPRESSION: 1. No acute intracranial abnormality. 2. Mild atrophy with evidence of chronic small vessel ischemic changes. Electronically Signed   By: Markus Daft M.D.   On: 06/16/2021 16:43   CT Cervical Spine Wo Contrast  Result Date: 06/16/2021 CLINICAL DATA:  Fall. EXAM: CT CERVICAL SPINE WITHOUT CONTRAST TECHNIQUE: Multidetector CT imaging of the cervical spine was performed without intravenous contrast. Multiplanar CT image reconstructions were also generated. RADIATION DOSE REDUCTION: This exam was performed according to the departmental dose-optimization program which includes automated exposure control, adjustment of the mA and/or kV according to patient size and/or use of iterative reconstruction technique. COMPARISON:  None. FINDINGS: Alignment: Mild kyphosis and minimal anterolisthesis at C5-C6. Normal alignment at the cervicothoracic junction.  Skull base and vertebrae: No acute fracture. No primary bone lesion or focal pathologic process. Soft tissues and spinal canal: No prevertebral fluid or swelling. No visible canal hematoma. Disc levels: Disc space narrowing most prominent at C5-C6 and C6-C7. Mild disc space narrowing at C3-C4. Bilateral facet arthropathy. Upper chest: Negative. Other: None. IMPRESSION: 1. No acute bone abnormality in the cervical spine. 2. Multilevel degenerative disease with mild kyphosis at C5-C6. Electronically Signed   By: Markus Daft M.D.   On: 06/16/2021 16:14    Procedures .Marland KitchenLaceration Repair  Date/Time: 06/16/2021 6:25 PM Performed by: Nehemiah Settle, PA-C Authorized by: Nehemiah Settle, PA-C   Consent:    Consent obtained:  Verbal   Consent given by:  Patient   Risks discussed:  Pain and infection Universal protocol:    Imaging studies available: yes     Patient identity confirmed:  Verbally with patient and hospital-assigned identification number Anesthesia:    Anesthesia method:  Local infiltration   Local anesthetic:  Lidocaine 2% WITH  epi (5 ml) Laceration details:    Location:  Shoulder/arm   Shoulder/arm location:  R lower arm   Length (cm):  7 Pre-procedure details:    Preparation:  Patient was prepped and draped in usual sterile fashion and imaging obtained to evaluate for foreign bodies Exploration:    Hemostasis achieved with:  Epinephrine and direct pressure   Imaging obtained: x-ray     Imaging outcome: foreign body not noted     Wound exploration: entire depth of wound visualized     Wound extent: no muscle damage noted     Contaminated: no   Treatment:    Area cleansed with:  Saline   Amount of cleaning:  Standard   Irrigation solution:  Sterile saline   Irrigation volume:  500 mL   Irrigation method:  Syringe   Visualized foreign bodies/material removed: no   Skin repair:    Repair method:  Sutures   Suture size:  4-0   Suture material:  Prolene   Suture technique:   Simple interrupted   Number of sutures:  4 Approximation:    Approximation:  Loose Repair type:    Repair type:  Simple Post-procedure details:    Dressing:  Sterile dressing   Procedure completion:  Tolerated well, no immediate complications    Medications Ordered in ED Medications  ceFAZolin (ANCEF) IVPB 1 g/50 mL premix (0 g Intravenous Stopped 06/16/21 1553)  Tdap (BOOSTRIX) injection 0.5 mL (0.5 mLs Intramuscular Given 06/16/21 1727)  lidocaine-EPINEPHrine (XYLOCAINE W/EPI) 2 %-1:200000 (PF) injection 20 mL (20 mLs Infiltration Given by Other 06/16/21 1749)    ED Course/ Medical Decision Making/ A&P Clinical Course as of 06/18/21 0009  Mon Jun 16, 2021  1501 Pleasant 88 year old male here for evaluation of injuries from a fall.  He has a deep gash to his right forearm with exposed muscle although muscle itself does not appear violated.  Normal range of motion.  Getting CT head spine and x-rays of forearm and will need wound repair. [MB]  28 Consult with hand surgeon, Dr. Tempie Donning who recommends thoroughly irrigating the wound and loosely approximate.  Dr. Robina Ade also notes that he will follow-up with patient in the office on either Friday 3/10 or Monday 3/13.  [SB]    Clinical Course User Index [MB] Hayden Rasmussen, MD [SB] Nehemiah Settle, PA-C                           Medical Decision Making Amount and/or Complexity of Data Reviewed Radiology: ordered.  Risk Prescription drug management.   Pt with fall occurring PTA. Denies hitting their head, LOC, headache, vision changes. Vital signs stable, pt afebrile.  Patient not on anticoagulants.  On exam, patient with 7 cm laceration noted to right lateral forearm. Strength and sensation intact to BUE. Radial pulses intact bilaterally. No focal neurological deficits on exam. No acute cardiovascular, respiratory, or abdominal exam findings. Differential diagnosis includes foreign body, laceration, intracranial abnormality,  fracture, dislocation.   Imaging: I ordered imaging studies including CT head, CT Cervical spine, right forearm xray I independently visualized and interpreted imaging which showed: CT head:  1. No acute intracranial abnormality.  2. Mild atrophy with evidence of chronic small vessel ischemic  changes.  CT cervical spine:  1. No acute bone abnormality in the cervical spine.  2. Multilevel degenerative disease with mild kyphosis at C5-C6.  Right forearm xray: No fracture.  No radiopaque foreign body. I agree  with the radiologist interpretation  Medications:  I ordered medication including ancef, tdap vaccine for antibiotic prophylaxis Reevaluation of the patient after these medicines and interventions, I reevaluated the patient and found that they have improved I have reviewed the patients home medicines and have made adjustments as needed   Consultations: I requested consultation with the Hand Surgeon, Dr. Tempie Donning, and discussed lab and imaging findings as well as pertinent plan - they recommend: loose approximation and follow up in office on 3/10 or 3/13.    Disposition: Patient presentation suspicious for laceration status post fall.  Doubt intracranial abnormality.  Doubt fracture or herniation of cervical spine. After consideration of the diagnostic results and the patients response to treatment, I feel that the patient would benefit from Discharge home.  Tetanus updated in the ED.  Patient given prophylactic dose of Ancef in the ED.  Laceration loosely approximated in the ED by myself.  Discussed with patient and relative at bedside importance of calling hand surgeon, Dr. Tempie Donning to set up a follow-up appointment regarding today's ED visit.  Discussed with patient and his son at bedside that Dr. Tempie Donning recommended follow-up on either 3/10 or 3/13 regarding today's ED visit.  Patient and family agreeable at this time.  Supportive care and return precautions discussed with patient and  significant other. Return precautions given to the patient including shortness of breath, worsening chest pain, or worsening headache. Appears safe for discharge at this time. Follow up as indicated in discharge paperwork.   This chart was dictated using voice recognition software, Dragon. Despite the best efforts of this provider to proofread and correct errors, errors may still occur which can change documentation meaning.  Final Clinical Impression(s) / ED Diagnoses Final diagnoses:  Laceration of right forearm    Rx / DC Orders ED Discharge Orders     None         Madi Bonfiglio A, PA-C 06/18/21 0009    Hayden Rasmussen, MD 06/18/21 1022

## 2021-06-16 NOTE — ED Triage Notes (Signed)
Patient here POV from Home from Fall. ? ?Patient slid off the Toilet approximately 0.5 hours PTA. Patient sustained approximately 5-7 cm Laceration to Medial/Posterior Right Arm from Vanity.  ? ?EMS responded and wrapped Wound. Irrigated and Wrapped in [employer].  ? ?No Head Injury. No Blood Thinning Medications. NAD Noted during Triage. A&Ox4. GCS 15. Ambulatory with Gilford Rile. BIB Wheelchair. ?

## 2021-06-16 NOTE — ED Notes (Signed)
ED Provider at bedside. 

## 2021-06-16 NOTE — Discharge Instructions (Addendum)
It was a pleasure taking care of you today!  ? ?Your imaging was unremarkable today. Your laceration to your right forearm was repaired in the ED. Your tetanus was updated in the ED. You may change your bandages every other day as needed. Attached is information for the on-call hand Surgeon, call and set up a follow up appointment with his office on Friday, 3/10 or Monday 3/13. You may follow up with your primary care provider as needed. Return to the Emergency Department if you are experiencing increasing/worsening fever, redness, pain, or worsening symptoms. ?

## 2021-06-20 ENCOUNTER — Ambulatory Visit: Payer: Medicare Other | Admitting: Orthopedic Surgery

## 2021-09-05 ENCOUNTER — Encounter (HOSPITAL_COMMUNITY): Payer: Self-pay

## 2021-09-05 ENCOUNTER — Emergency Department (HOSPITAL_COMMUNITY): Payer: Medicare Other

## 2021-09-05 ENCOUNTER — Inpatient Hospital Stay (HOSPITAL_COMMUNITY)
Admission: EM | Admit: 2021-09-05 | Discharge: 2021-09-09 | DRG: 086 | Disposition: A | Discharge status: Hospice | Payer: Medicare Other | Attending: Internal Medicine | Admitting: Internal Medicine

## 2021-09-05 ENCOUNTER — Inpatient Hospital Stay (HOSPITAL_COMMUNITY): Payer: Medicare Other

## 2021-09-05 ENCOUNTER — Other Ambulatory Visit: Payer: Self-pay

## 2021-09-05 DIAGNOSIS — Z515 Encounter for palliative care: Secondary | ICD-10-CM

## 2021-09-05 DIAGNOSIS — F419 Anxiety disorder, unspecified: Secondary | ICD-10-CM | POA: Diagnosis present

## 2021-09-05 DIAGNOSIS — S065XAA Traumatic subdural hemorrhage with loss of consciousness status unknown, initial encounter: Secondary | ICD-10-CM

## 2021-09-05 DIAGNOSIS — L03119 Cellulitis of unspecified part of limb: Secondary | ICD-10-CM | POA: Diagnosis not present

## 2021-09-05 DIAGNOSIS — Z85828 Personal history of other malignant neoplasm of skin: Secondary | ICD-10-CM | POA: Diagnosis not present

## 2021-09-05 DIAGNOSIS — I482 Chronic atrial fibrillation, unspecified: Secondary | ICD-10-CM | POA: Diagnosis not present

## 2021-09-05 DIAGNOSIS — W19XXXA Unspecified fall, initial encounter: Secondary | ICD-10-CM

## 2021-09-05 DIAGNOSIS — F03918 Unspecified dementia, unspecified severity, with other behavioral disturbance: Secondary | ICD-10-CM | POA: Diagnosis not present

## 2021-09-05 DIAGNOSIS — S2242XA Multiple fractures of ribs, left side, initial encounter for closed fracture: Secondary | ICD-10-CM | POA: Diagnosis present

## 2021-09-05 DIAGNOSIS — N183 Chronic kidney disease, stage 3 unspecified: Secondary | ICD-10-CM

## 2021-09-05 DIAGNOSIS — I10 Essential (primary) hypertension: Secondary | ICD-10-CM | POA: Diagnosis not present

## 2021-09-05 DIAGNOSIS — K219 Gastro-esophageal reflux disease without esophagitis: Secondary | ICD-10-CM | POA: Diagnosis present

## 2021-09-05 DIAGNOSIS — Z7189 Other specified counseling: Secondary | ICD-10-CM | POA: Diagnosis not present

## 2021-09-05 DIAGNOSIS — D539 Nutritional anemia, unspecified: Secondary | ICD-10-CM | POA: Diagnosis present

## 2021-09-05 DIAGNOSIS — S12100A Unspecified displaced fracture of second cervical vertebra, initial encounter for closed fracture: Secondary | ICD-10-CM | POA: Diagnosis present

## 2021-09-05 DIAGNOSIS — S32029A Unspecified fracture of second lumbar vertebra, initial encounter for closed fracture: Secondary | ICD-10-CM | POA: Diagnosis present

## 2021-09-05 DIAGNOSIS — L03116 Cellulitis of left lower limb: Secondary | ICD-10-CM | POA: Diagnosis present

## 2021-09-05 DIAGNOSIS — N1831 Chronic kidney disease, stage 3a: Secondary | ICD-10-CM | POA: Diagnosis present

## 2021-09-05 DIAGNOSIS — Z79899 Other long term (current) drug therapy: Secondary | ICD-10-CM | POA: Diagnosis not present

## 2021-09-05 DIAGNOSIS — S32009A Unspecified fracture of unspecified lumbar vertebra, initial encounter for closed fracture: Secondary | ICD-10-CM

## 2021-09-05 DIAGNOSIS — E860 Dehydration: Secondary | ICD-10-CM | POA: Diagnosis not present

## 2021-09-05 DIAGNOSIS — R402253 Coma scale, best verbal response, oriented, at hospital admission: Secondary | ICD-10-CM | POA: Diagnosis present

## 2021-09-05 DIAGNOSIS — W109XXA Fall (on) (from) unspecified stairs and steps, initial encounter: Secondary | ICD-10-CM | POA: Diagnosis present

## 2021-09-05 DIAGNOSIS — S2249XA Multiple fractures of ribs, unspecified side, initial encounter for closed fracture: Secondary | ICD-10-CM

## 2021-09-05 DIAGNOSIS — Z66 Do not resuscitate: Secondary | ICD-10-CM | POA: Diagnosis present

## 2021-09-05 DIAGNOSIS — L0390 Cellulitis, unspecified: Secondary | ICD-10-CM

## 2021-09-05 DIAGNOSIS — W19XXXD Unspecified fall, subsequent encounter: Secondary | ICD-10-CM | POA: Diagnosis not present

## 2021-09-05 DIAGNOSIS — H9190 Unspecified hearing loss, unspecified ear: Secondary | ICD-10-CM | POA: Diagnosis present

## 2021-09-05 DIAGNOSIS — T07XXXA Unspecified multiple injuries, initial encounter: Principal | ICD-10-CM

## 2021-09-05 DIAGNOSIS — I4891 Unspecified atrial fibrillation: Secondary | ICD-10-CM | POA: Diagnosis present

## 2021-09-05 DIAGNOSIS — S065X0A Traumatic subdural hemorrhage without loss of consciousness, initial encounter: Secondary | ICD-10-CM | POA: Diagnosis present

## 2021-09-05 DIAGNOSIS — S0281XA Fracture of other specified skull and facial bones, right side, initial encounter for closed fracture: Secondary | ICD-10-CM | POA: Diagnosis present

## 2021-09-05 DIAGNOSIS — I129 Hypertensive chronic kidney disease with stage 1 through stage 4 chronic kidney disease, or unspecified chronic kidney disease: Secondary | ICD-10-CM | POA: Diagnosis present

## 2021-09-05 DIAGNOSIS — F0390 Unspecified dementia without behavioral disturbance: Secondary | ICD-10-CM | POA: Diagnosis present

## 2021-09-05 DIAGNOSIS — S0083XA Contusion of other part of head, initial encounter: Secondary | ICD-10-CM

## 2021-09-05 DIAGNOSIS — R55 Syncope and collapse: Secondary | ICD-10-CM | POA: Diagnosis present

## 2021-09-05 DIAGNOSIS — F32A Depression, unspecified: Secondary | ICD-10-CM | POA: Diagnosis present

## 2021-09-05 DIAGNOSIS — S32019A Unspecified fracture of first lumbar vertebra, initial encounter for closed fracture: Secondary | ICD-10-CM | POA: Diagnosis present

## 2021-09-05 DIAGNOSIS — S12101D Unspecified nondisplaced fracture of second cervical vertebra, subsequent encounter for fracture with routine healing: Secondary | ICD-10-CM | POA: Diagnosis not present

## 2021-09-05 DIAGNOSIS — Y92009 Unspecified place in unspecified non-institutional (private) residence as the place of occurrence of the external cause: Secondary | ICD-10-CM | POA: Diagnosis not present

## 2021-09-05 DIAGNOSIS — L03115 Cellulitis of right lower limb: Secondary | ICD-10-CM | POA: Diagnosis present

## 2021-09-05 DIAGNOSIS — S12101A Unspecified nondisplaced fracture of second cervical vertebra, initial encounter for closed fracture: Secondary | ICD-10-CM | POA: Diagnosis not present

## 2021-09-05 DIAGNOSIS — R402363 Coma scale, best motor response, obeys commands, at hospital admission: Secondary | ICD-10-CM | POA: Diagnosis present

## 2021-09-05 DIAGNOSIS — R296 Repeated falls: Secondary | ICD-10-CM | POA: Diagnosis present

## 2021-09-05 DIAGNOSIS — R402143 Coma scale, eyes open, spontaneous, at hospital admission: Secondary | ICD-10-CM | POA: Diagnosis present

## 2021-09-05 LAB — I-STAT CHEM 8, ED
BUN: 22 mg/dL (ref 8–23)
Calcium, Ion: 1.04 mmol/L — ABNORMAL LOW (ref 1.15–1.40)
Chloride: 104 mmol/L (ref 98–111)
Creatinine, Ser: 1.4 mg/dL — ABNORMAL HIGH (ref 0.61–1.24)
Glucose, Bld: 189 mg/dL — ABNORMAL HIGH (ref 70–99)
HCT: 33 % — ABNORMAL LOW (ref 39.0–52.0)
Hemoglobin: 11.2 g/dL — ABNORMAL LOW (ref 13.0–17.0)
Potassium: 3.7 mmol/L (ref 3.5–5.1)
Sodium: 138 mmol/L (ref 135–145)
TCO2: 25 mmol/L (ref 22–32)

## 2021-09-05 LAB — COMPREHENSIVE METABOLIC PANEL
ALT: 13 U/L (ref 0–44)
AST: 28 U/L (ref 15–41)
Albumin: 3.4 g/dL — ABNORMAL LOW (ref 3.5–5.0)
Alkaline Phosphatase: 75 U/L (ref 38–126)
Anion gap: 11 (ref 5–15)
BUN: 21 mg/dL (ref 8–23)
CO2: 23 mmol/L (ref 22–32)
Calcium: 9.3 mg/dL (ref 8.9–10.3)
Chloride: 105 mmol/L (ref 98–111)
Creatinine, Ser: 1.34 mg/dL — ABNORMAL HIGH (ref 0.61–1.24)
GFR, Estimated: 48 mL/min — ABNORMAL LOW (ref >60)
Glucose, Bld: 188 mg/dL — ABNORMAL HIGH (ref 70–99)
Potassium: 3.8 mmol/L (ref 3.5–5.1)
Sodium: 139 mmol/L (ref 135–145)
Total Bilirubin: 1.9 mg/dL — ABNORMAL HIGH (ref 0.3–1.2)
Total Protein: 7.1 g/dL (ref 6.5–8.1)

## 2021-09-05 LAB — LACTIC ACID, PLASMA: Lactic Acid, Venous: 2.1 mmol/L (ref 0.5–1.9)

## 2021-09-05 LAB — CBC
HCT: 35.9 % — ABNORMAL LOW (ref 39.0–52.0)
Hemoglobin: 11.4 g/dL — ABNORMAL LOW (ref 13.0–17.0)
MCH: 33.2 pg (ref 26.0–34.0)
MCHC: 31.8 g/dL (ref 30.0–36.0)
MCV: 104.7 fL — ABNORMAL HIGH (ref 80.0–100.0)
Platelets: 238 10*3/uL (ref 150–400)
RBC: 3.43 MIL/uL — ABNORMAL LOW (ref 4.22–5.81)
RDW: 16.8 % — ABNORMAL HIGH (ref 11.5–15.5)
WBC: 11.8 10*3/uL — ABNORMAL HIGH (ref 4.0–10.5)
nRBC: 0 % (ref 0.0–0.2)

## 2021-09-05 LAB — FOLATE: Folate: >40 ng/mL (ref >5.9)

## 2021-09-05 LAB — SAMPLE TO BLOOD BANK

## 2021-09-05 LAB — VITAMIN B12: Vitamin B-12: 668 pg/mL (ref 180–914)

## 2021-09-05 LAB — RETICULOCYTES
Immature Retic Fract: 26.1 % — ABNORMAL HIGH (ref 2.3–15.9)
RBC.: 3.41 MIL/uL — ABNORMAL LOW (ref 4.22–5.81)
Retic Count, Absolute: 143.2 10*3/uL (ref 19.0–186.0)
Retic Ct Pct: 4.2 % — ABNORMAL HIGH (ref 0.4–3.1)

## 2021-09-05 LAB — ETHANOL: Alcohol, Ethyl (B): <10 mg/dL (ref <10)

## 2021-09-05 LAB — IRON AND TIBC
Iron: 58 ug/dL (ref 45–182)
Saturation Ratios: 16 % — ABNORMAL LOW (ref 17.9–39.5)
TIBC: 374 ug/dL (ref 250–450)
UIBC: 316 ug/dL

## 2021-09-05 LAB — FERRITIN: Ferritin: 38 ng/mL (ref 24–336)

## 2021-09-05 LAB — PROTIME-INR
INR: 1.4 — ABNORMAL HIGH (ref 0.8–1.2)
Prothrombin Time: 16.6 seconds — ABNORMAL HIGH (ref 11.4–15.2)

## 2021-09-05 LAB — CK: Total CK: 160 U/L (ref 49–397)

## 2021-09-05 MED ORDER — IOHEXOL 300 MG/ML  SOLN
80.0000 mL | Freq: Once | INTRAMUSCULAR | Status: AC | PRN
  Administered 2021-09-05: 80 mL via INTRAVENOUS

## 2021-09-05 MED ORDER — LISINOPRIL-HYDROCHLOROTHIAZIDE 20-25 MG PO TABS: 1.0000 | ORAL_TABLET | Freq: Every day | ORAL | Status: DC

## 2021-09-05 MED ORDER — CEFAZOLIN SODIUM-DEXTROSE 1-4 GM/50ML-% IV SOLN
1.0000 g | Freq: Once | INTRAVENOUS | Status: DC
Start: 2021-09-05 — End: 2021-09-05

## 2021-09-05 MED ORDER — ONDANSETRON HCL 4 MG/2ML IJ SOLN: 4.0000 mg | Freq: Four times a day (QID) | INTRAMUSCULAR | Status: DC | PRN

## 2021-09-05 MED ORDER — METOPROLOL SUCCINATE ER 25 MG PO TB24: 25.0000 mg | ORAL_TABLET | Freq: Every day | ORAL | Status: DC

## 2021-09-05 MED ORDER — PANTOPRAZOLE SODIUM 40 MG PO TBEC: 40.0000 mg | DELAYED_RELEASE_TABLET | Freq: Every day | ORAL | Status: DC

## 2021-09-05 MED ORDER — SODIUM CHLORIDE 0.9 % IV SOLN: INTRAVENOUS | Status: AC

## 2021-09-05 MED ORDER — ACETAMINOPHEN 650 MG RE SUPP
650.0000 mg | Freq: Four times a day (QID) | RECTAL | Status: DC | PRN
Start: 2021-09-05 — End: 2021-09-09

## 2021-09-05 MED ORDER — CEFAZOLIN SODIUM-DEXTROSE 1-4 GM/50ML-% IV SOLN
1.0000 g | Freq: Three times a day (TID) | INTRAVENOUS | Status: DC
  Administered 2021-09-05 – 2021-09-09 (×12): 1 g via INTRAVENOUS
  Filled 2021-09-05 (×15): qty 50

## 2021-09-05 MED ORDER — ONDANSETRON HCL 4 MG/2ML IJ SOLN
4.0000 mg | Freq: Once | INTRAMUSCULAR | Status: AC
  Administered 2021-09-05: 4 mg via INTRAVENOUS
  Filled 2021-09-05: qty 2

## 2021-09-05 MED ORDER — HYDROCHLOROTHIAZIDE 25 MG PO TABS
25.0000 mg | ORAL_TABLET | Freq: Every day | ORAL | Status: DC
Start: 2021-09-05 — End: 2021-09-08
  Administered 2021-09-05: 25 mg via ORAL
  Filled 2021-09-05: qty 1

## 2021-09-05 MED ORDER — ONDANSETRON HCL 4 MG PO TABS: 4.0000 mg | ORAL_TABLET | Freq: Four times a day (QID) | ORAL | Status: DC | PRN

## 2021-09-05 MED ORDER — PAROXETINE HCL 10 MG PO TABS
10.0000 mg | ORAL_TABLET | Freq: Every day | ORAL | Status: DC
  Filled 2021-09-05 (×3): qty 1

## 2021-09-05 MED ORDER — LISINOPRIL 20 MG PO TABS
20.0000 mg | ORAL_TABLET | Freq: Every day | ORAL | Status: DC
  Administered 2021-09-05: 20 mg via ORAL
  Filled 2021-09-05: qty 1

## 2021-09-05 MED ORDER — ACETAMINOPHEN 325 MG PO TABS: 650.0000 mg | ORAL_TABLET | Freq: Four times a day (QID) | ORAL | Status: DC | PRN

## 2021-09-05 MED ORDER — HALOPERIDOL LACTATE 5 MG/ML IJ SOLN
2.0000 mg | Freq: Four times a day (QID) | INTRAMUSCULAR | Status: DC | PRN
  Administered 2021-09-07: 2 mg via INTRAVENOUS
  Filled 2021-09-05: qty 1

## 2021-09-05 MED ORDER — HYDROMORPHONE HCL 1 MG/ML IJ SOLN
0.5000 mg | INTRAMUSCULAR | Status: DC | PRN
  Administered 2021-09-05: 1 mg via INTRAVENOUS
  Administered 2021-09-06: 0.5 mg via INTRAVENOUS
  Administered 2021-09-06 – 2021-09-09 (×8): 1 mg via INTRAVENOUS
  Filled 2021-09-05 (×11): qty 1

## 2021-09-05 MED ORDER — SODIUM CHLORIDE 0.9 % IV BOLUS: 500.0000 mL | Freq: Once | INTRAVENOUS | Status: DC

## 2021-09-05 MED ORDER — MORPHINE SULFATE (PF) 2 MG/ML IV SOLN
2.0000 mg | Freq: Once | INTRAVENOUS | Status: AC
  Administered 2021-09-05: 2 mg via INTRAVENOUS
  Filled 2021-09-05: qty 1

## 2021-09-05 MED ORDER — IOHEXOL 350 MG/ML SOLN
100.0000 mL | Freq: Once | INTRAVENOUS | Status: AC | PRN
  Administered 2021-09-05: 75 mL via INTRAVENOUS

## 2021-09-05 NOTE — Progress Notes (Signed)
Pharmacy Antibiotic Note  Nichole Keltner is a 88 y.o. male admitted on 09/05/2021 found down at home, also with bilateral cellulitis.  Pharmacy has been consulted for cefazolin dosing.  Plan: Cefazolin 1g IV every 8 hours Monitor renal function, clinical progression and LOT     Temp (24hrs), Avg:98.6 F (37 C), Min:98.6 F (37 C), Max:98.6 F (37 C)  Recent Labs  Lab 09/05/21 1422 09/05/21 1453  WBC 11.8*  --   CREATININE 1.34* 1.40*  LATICACIDVEN 2.1*  --     CrCl cannot be calculated (Unknown ideal weight.).    No Known Allergies  Bertis Ruddy, PharmD Clinical Pharmacist ED Pharmacist Phone # [phone] 09/05/2021 6:51 PM

## 2021-09-05 NOTE — H&P (Addendum)
History and Physical    Jesse Benjamin PFX:[id number] DOB: [birth year] DOA: 09/05/2021  PCP: London Pepper, MD   Patient coming from: Home  Chief Complaint: Fall  HPI: Jesse Benjamin is a 88 y.o. male with medical history significant for hypertension, atrial fibrillation, CKD stage III, advanced dementia, frequent falls, and GERD.  He was brought to the ED after he was found down at home after his daughter and her husband returned home after having lunch.  It is not entirely certain what happened, but he was near the stairs and they assumed that it must have been related to him trying to climb the stairs.  The patient cannot give any history given his dementia and the fact that he is hard of hearing.  Daughter states that he has recently been more agitated in the last 2 days as well.   ED Course: Vital signs stable with some mild blood pressure elevations noted.  Mild leukocytosis of 11,800 and hemoglobin 11.2.  Creatinine appears to be at baseline at 1.4 and lactic acid is 1.2.  Multiple imaging studies performed with noted C2 lamina fracture as well as L1 and L2 transverse process fractures as well as left-sided rib fractures.  Acute subdural hematoma also present on CT scan.  Trauma surgery has been contacted for further evaluation and patient is currently in c-collar.  Neurosurgery has also been contacted for further evaluation.  Patient is currently in c-collar.  He is also noted to have bilateral lower extremity cellulitis and has been started on Ancef.  Review of Systems: Could not be obtained given patient condition.  Past Medical History:  Diagnosis Date   Atrial fibrillation ([place])    Bronchitis, acute    Cancer ([place])    CKD (chronic kidney disease)    Dementia ([place])    Frequent falls    Hypertension    Palpitations     Past Surgical History:  Procedure Laterality Date   CAROTID BODY TUMOR EXCISION     KNEE SURGERY Right    RECURRENT HERNIA     x 2     reports that he has never  smoked. He has never used smokeless tobacco. He reports that he does not drink alcohol and does not use drugs.  No Known Allergies  History reviewed. No pertinent family history.  Prior to Admission medications   Medication Sig Start Date End Date Taking? Authorizing Provider  CRANBERRY PO Take 2 tablets by mouth daily.   Yes [provider]  lisinopril-hydrochlorothiazide (PRINZIDE,ZESTORETIC) 20-25 MG tablet Take 1 tablet by mouth daily.   Yes [provider]  metoprolol succinate (TOPROL-XL) 25 MG 24 hr tablet Take 25 mg by mouth daily.   Yes [provider]  Multiple Vitamin (MULTIVITAMIN WITH MINERALS) TABS tablet Take 1 tablet by mouth daily.   Yes [provider]  omeprazole (PRILOSEC) 20 MG capsule Take 20 mg by mouth daily.   Yes [provider]  PARoxetine (PAXIL) 10 MG tablet Take 10 mg by mouth daily.   Yes [provider]    Physical Exam: Vitals:   09/05/21 1500 09/05/21 1604 09/05/21 1634 09/05/21 1745  BP: (!) 141/82  (!) 120/109 (!) 144/98  Pulse: 91 89 80 91  Resp: '14 16 20 17  '$ Temp:      SpO2: 93% 95% 95% 94%    Constitutional: NAD, calm, comfortable, hard of hearing and incoherent Vitals:   09/05/21 1500 09/05/21 1604 09/05/21 1634 09/05/21 1745  BP: (!) 141/82  Marland Kitchen)  120/109 (!) 144/98  Pulse: 91 89 80 91  Resp: '14 16 20 17  '$ Temp:      SpO2: 93% 95% 95% 94%   Eyes: lids and conjunctivae normal Neck: normal, supple Respiratory: clear to auscultation bilaterally. Normal respiratory effort. No accessory muscle use.  Cardiovascular: Regular rate and rhythm, no murmurs. Abdomen: no tenderness, no distention. Bowel sounds positive.  Musculoskeletal:  bruising throughout   Skin:   Psychiatric: Cannot be assessed.  Labs on Admission: I have personally reviewed following labs and imaging studies  CBC: Recent Labs  Lab 09/05/21 1422 09/05/21 1453  WBC 11.8*  --   HGB 11.4* 11.2*  HCT 35.9* 33.0*   MCV 104.7*  --   PLT 238  --    Basic Metabolic Panel: Recent Labs  Lab 09/05/21 1422 09/05/21 1453  NA 139 138  K 3.8 3.7  CL 105 104  CO2 23  --   GLUCOSE 188* 189*  BUN 21 22  CREATININE 1.34* 1.40*  CALCIUM 9.3  --    GFR: CrCl cannot be calculated (Unknown ideal weight.). Liver Function Tests: Recent Labs  Lab 09/05/21 1422  AST 28  ALT 13  ALKPHOS 75  BILITOT 1.9*  PROT 7.1  ALBUMIN 3.4*   No results for input(s): LIPASE, AMYLASE in the last 168 hours. No results for input(s): AMMONIA in the last 168 hours. Coagulation Profile: Recent Labs  Lab 09/05/21 1422  INR 1.4*   Cardiac Enzymes: Recent Labs  Lab 09/05/21 1422  CKTOTAL 160   BNP (last 3 results) No results for input(s): PROBNP in the last 8760 hours. HbA1C: No results for input(s): HGBA1C in the last 72 hours. CBG: No results for input(s): GLUCAP in the last 168 hours. Lipid Profile: No results for input(s): CHOL, HDL, LDLCALC, TRIG, CHOLHDL, LDLDIRECT in the last 72 hours. Thyroid Function Tests: No results for input(s): TSH, T4TOTAL, FREET4, T3FREE, THYROIDAB in the last 72 hours. Anemia Panel: No results for input(s): VITAMINB12, FOLATE, FERRITIN, TIBC, IRON, RETICCTPCT in the last 72 hours. Urine analysis:    Component Value Date/Time   COLORURINE YELLOW 01/20/2016 1834   APPEARANCEUR CLEAR 01/20/2016 1834   LABSPEC 1.020 01/20/2016 1834   PHURINE 5.5 01/20/2016 1834   GLUCOSEU NEGATIVE 01/20/2016 1834   HGBUR NEGATIVE 01/20/2016 1834   BILIRUBINUR NEGATIVE 01/20/2016 1834   KETONESUR NEGATIVE 01/20/2016 1834   PROTEINUR NEGATIVE 01/20/2016 1834   NITRITE NEGATIVE 01/20/2016 1834   LEUKOCYTESUR NEGATIVE 01/20/2016 1834    Radiological Exams on Admission: CT HEAD WO CONTRAST  Result Date: 09/05/2021 CLINICAL DATA:  Head trauma, minor (Age >= 65y); Neck trauma (Age >= 65y); Facial trauma, blunt EXAM: CT HEAD WITHOUT CONTRAST CT MAXILLOFACIAL WITHOUT CONTRAST CT CERVICAL SPINE  WITHOUT CONTRAST TECHNIQUE: Multidetector CT imaging of the head, cervical spine, and maxillofacial structures were performed using the standard protocol without intravenous contrast. Multiplanar CT image reconstructions of the cervical spine and maxillofacial structures were also generated. RADIATION DOSE REDUCTION: This exam was performed according to the departmental dose-optimization program which includes automated exposure control, adjustment of the mA and/or kV according to patient size and/or use of iterative reconstruction technique. COMPARISON:  None Available. FINDINGS: CT HEAD FINDINGS Brain: Small (2 mm) area of hyperdensity along the anterior left falx (see series 4, 23) appears new from the prior and is concerning for acute subdural hemorrhage. No evidence of acute infarction, hydrocephalus, extra-axial collection or mass lesion/mass effect. Chronic microvascular ischemic disease and atrophy. Vascular: Calcific intracranial atherosclerosis. Skull:  Large right forehead contusion without acute calvarial fracture. Other: No mastoid effusions. CT MAXILLOFACIAL FINDINGS Osseous: Significantly motion limited evaluation without obvious fracture. Evaluation of the inferior orbits and midface is particularly limited and nondiagnostic Orbits: Motion limited without visible abnormality. Evaluation of the orbits is nondiagnostic inferiorly. Sinuses: Clear. Soft tissues: Large right periorbital/forehead contusion. CT CERVICAL SPINE FINDINGS Alignment: Rotation of C1 on C2. Similar mild degenerative anterolisthesis of C5 on C6. Otherwise, no substantial sagittal subluxation Skull base and vertebrae: Mildly displaced fracture through the right lamina C2 (see series 4, image 17; series 7, image 27). Motion limits assessment with possible nondisplaced fracture extending into the right transverse foramen at C2 (series 4, image 23). Soft tissues and spinal canal: Motion limited without obvious prevertebral fluid or  swelling. No visible canal hematoma. Disc levels: Similar multilevel severe degenerative disc disease with multilevel facet and uncovertebral hypertrophy. Resulting varying degrees of neural foraminal stenosis. Upper chest: Visualized lung apices are clear. IMPRESSION: CT cervical spine: 1. Mildly displaced fracture through the right lamina C2. 2. Motion limited study with possible additional fracture extending through the transverse foramen of C2 on the right. Given this finding, recommend CTA to assess for arterial injury. 3. Mild rotation of C1 on C2, which may be positional, but MRI could provide more sensitive evaluation for ligamentous injury if clinically warranted. CT head: 1. Small (2 mm) area of hyperdensity along the anterior left falx appears new from the prior and is concerning for acute subdural hemorrhage. 2. Large right forehead/periorbital contusion without visible acute calvarial fracture 3. Motion limited study. Maxillofacial CT: Significantly motion limited study without obvious evidence of acute fracture. Evaluation of the inferior orbits and midface is particularly limited and nondiagnostic. Given the large right periorbital/forehead contusion, consider follow-up maxillofacial CT to exclude fracture. Findings discussed with Dr. Rocky Crafts via telephone at 4:04 p.m. Electronically Signed   By: Margaretha Sheffield M.D.   On: 09/05/2021 16:06   CT CERVICAL SPINE WO CONTRAST  Result Date: 09/05/2021 CLINICAL DATA:  Head trauma, minor (Age >= 65y); Neck trauma (Age >= 65y); Facial trauma, blunt EXAM: CT HEAD WITHOUT CONTRAST CT MAXILLOFACIAL WITHOUT CONTRAST CT CERVICAL SPINE WITHOUT CONTRAST TECHNIQUE: Multidetector CT imaging of the head, cervical spine, and maxillofacial structures were performed using the standard protocol without intravenous contrast. Multiplanar CT image reconstructions of the cervical spine and maxillofacial structures were also generated. RADIATION DOSE REDUCTION: This exam  was performed according to the departmental dose-optimization program which includes automated exposure control, adjustment of the mA and/or kV according to patient size and/or use of iterative reconstruction technique. COMPARISON:  None Available. FINDINGS: CT HEAD FINDINGS Brain: Small (2 mm) area of hyperdensity along the anterior left falx (see series 4, 23) appears new from the prior and is concerning for acute subdural hemorrhage. No evidence of acute infarction, hydrocephalus, extra-axial collection or mass lesion/mass effect. Chronic microvascular ischemic disease and atrophy. Vascular: Calcific intracranial atherosclerosis. Skull: Large right forehead contusion without acute calvarial fracture. Other: No mastoid effusions. CT MAXILLOFACIAL FINDINGS Osseous: Significantly motion limited evaluation without obvious fracture. Evaluation of the inferior orbits and midface is particularly limited and nondiagnostic Orbits: Motion limited without visible abnormality. Evaluation of the orbits is nondiagnostic inferiorly. Sinuses: Clear. Soft tissues: Large right periorbital/forehead contusion. CT CERVICAL SPINE FINDINGS Alignment: Rotation of C1 on C2. Similar mild degenerative anterolisthesis of C5 on C6. Otherwise, no substantial sagittal subluxation Skull base and vertebrae: Mildly displaced fracture through the right lamina C2 (see series 4, image 17; series 7,  image 27). Motion limits assessment with possible nondisplaced fracture extending into the right transverse foramen at C2 (series 4, image 23). Soft tissues and spinal canal: Motion limited without obvious prevertebral fluid or swelling. No visible canal hematoma. Disc levels: Similar multilevel severe degenerative disc disease with multilevel facet and uncovertebral hypertrophy. Resulting varying degrees of neural foraminal stenosis. Upper chest: Visualized lung apices are clear. IMPRESSION: CT cervical spine: 1. Mildly displaced fracture through the  right lamina C2. 2. Motion limited study with possible additional fracture extending through the transverse foramen of C2 on the right. Given this finding, recommend CTA to assess for arterial injury. 3. Mild rotation of C1 on C2, which may be positional, but MRI could provide more sensitive evaluation for ligamentous injury if clinically warranted. CT head: 1. Small (2 mm) area of hyperdensity along the anterior left falx appears new from the prior and is concerning for acute subdural hemorrhage. 2. Large right forehead/periorbital contusion without visible acute calvarial fracture 3. Motion limited study. Maxillofacial CT: Significantly motion limited study without obvious evidence of acute fracture. Evaluation of the inferior orbits and midface is particularly limited and nondiagnostic. Given the large right periorbital/forehead contusion, consider follow-up maxillofacial CT to exclude fracture. Findings discussed with Dr. Rocky Crafts via telephone at 4:04 p.m. Electronically Signed   By: Margaretha Sheffield M.D.   On: 09/05/2021 16:06   DG Pelvis Portable  Result Date: 09/05/2021 CLINICAL DATA:  Trauma. EXAM: PORTABLE PELVIS 1-2 VIEWS COMPARISON:  None Available. FINDINGS: There is no evidence of pelvic fracture or diastasis. No pelvic bone lesions are seen. IMPRESSION: Negative. Electronically Signed   By: Marijo Conception M.D.   On: 09/05/2021 14:26   CT CHEST ABDOMEN PELVIS W CONTRAST  Result Date: 09/05/2021 CLINICAL DATA:  Unwitnessed fall. EXAM: CT CHEST, ABDOMEN, AND PELVIS WITH CONTRAST TECHNIQUE: Multidetector CT imaging of the chest, abdomen and pelvis was performed following the standard protocol during bolus administration of intravenous contrast. RADIATION DOSE REDUCTION: This exam was performed according to the departmental dose-optimization program which includes automated exposure control, adjustment of the mA and/or kV according to patient size and/or use of iterative reconstruction technique.  CONTRAST:  70m OMNIPAQUE IOHEXOL 300 MG/ML  SOLN COMPARISON:  None Available. FINDINGS: CT CHEST FINDINGS Cardiovascular: Cardiac enlargement. No pericardial effusion. Aortic atherosclerosis and coronary artery calcifications. Mediastinum/Nodes: Soft tissue stranding along the right lower neck is identified which may reflect sequelae of contusion, image 9/3. Thyroid gland, trachea, and esophagus are unremarkable. No enlarged axillary, supraclavicular, mediastinal, or hilar lymph nodes. Lungs/Pleura: Small to moderate right pleural effusion is identified. This is low in attenuation measuring 12 Hounsfield units, image 45/3. Trace left pleural effusion is also noted. Atelectasis and ground-glass attenuation overlying the right pleural effusion is identified. No pneumothorax or signs of contusion. No suspicious lung nodules. Musculoskeletal: No acute osseous findings. Remote left posterior rib fracture deformities appear healed. No acute rib fractures identified bilaterally. CT ABDOMEN PELVIS FINDINGS Hepatobiliary: No hepatic injury or perihepatic hematoma. Status post cholecystectomy. Pancreas: Unremarkable. No pancreatic ductal dilatation or surrounding inflammatory changes. Spleen: No splenic injury or perisplenic hematoma. Adrenals/Urinary Tract: No adrenal hemorrhage or renal injury identified. Bilateral Bosniak class 1 kidney cysts. No follow-up recommended. Bladder is unremarkable. Stomach/Bowel: Stomach is within normal limits. Sigmoid diverticulosis without signs of acute diverticulitis. No evidence of bowel wall thickening, distention, or inflammatory changes. Vascular/Lymphatic: Aortic atherosclerosis without aneurysm. No signs of abdominopelvic adenopathy. Reproductive: Prostate gland enlargement. Other: No free fluid or fluid collections within the abdomen  or pelvis. There is subcutaneous soft tissue stranding along the lower ventral pelvic wall centered at the level of the pubic symphysis. Small  subcutaneous hematoma is noted measuring 1.3 x 2.2 cm. Soft tissue stranding extends towards the base of the penis. Findings may reflect contusion after fall. Musculoskeletal: There are acute fractures involving the right L1 and L2 transverse processes, image 68/3 and image 75/3. There is an age indeterminate compression fracture involving the T12 vertebra with loss of approximately 50% of the vertebral body height. Increased sclerosis within the vertebral body suggests a subacute or chronic fracture. IMPRESSION: 1. Acute fractures involving the right L1 and L2 transverse processes. 2. Subcutaneous soft tissue stranding along the lower ventral pelvic wall centered at the level of the pubic symphysis with small subcutaneous hematoma. Findings may reflect contusion secondary to fall. 3. Age indeterminate T12 compression fracture with loss of approximately 50% of the vertebral body height. 4. Subcutaneous soft tissue stranding is noted along the right lower neck, which is concerning for soft tissue contusion. 5. Bilateral pleural effusions, right greater than left. 6. No signs of pulmonary contusion or pneumothorax. No evidence for solid organ injury within the abdomen or pelvis. 7. Aortic Atherosclerosis (ICD10-I70.0). Electronically Signed   By: Kerby Moors M.D.   On: 09/05/2021 16:04   DG Chest Port 1 View  Result Date: 09/05/2021 CLINICAL DATA:  Trauma in a 88 year old male. EXAM: PORTABLE CHEST 1 VIEW COMPARISON:  Comparison is made with examination from January 20, 2016. FINDINGS: Lung volumes are low. Heart size accentuated by portable technique, low lung volumes and AP projection. Mediastinal contours and hilar structures grossly stable accounting for these findings compared to previous imaging. No visible pneumothorax. Bibasilar airspace disease RIGHT greater than LEFT, somewhat indistinct on the RIGHT and partially obscuring RIGHT heart border. LEFT eighth and ninth rib fractures may be recent, not seen  previously. Mildly indistinct appearance of the eighth rib fracture with more acute appearance of the ninth rib fracture, may be projectional in terms of the difference. IMPRESSION: 1. Low lung volumes with bibasilar airspace disease RIGHT greater than LEFT. Query aspiration or pneumonia on the RIGHT. 2. LEFT sided rib fractures appear mildly displaced and may be recent/acute. Electronically Signed   By: Zetta Bills M.D.   On: 09/05/2021 14:28   CT MAXILLOFACIAL WO CONTRAST  Result Date: 09/05/2021 CLINICAL DATA:  Head trauma, minor (Age >= 65y); Neck trauma (Age >= 65y); Facial trauma, blunt EXAM: CT HEAD WITHOUT CONTRAST CT MAXILLOFACIAL WITHOUT CONTRAST CT CERVICAL SPINE WITHOUT CONTRAST TECHNIQUE: Multidetector CT imaging of the head, cervical spine, and maxillofacial structures were performed using the standard protocol without intravenous contrast. Multiplanar CT image reconstructions of the cervical spine and maxillofacial structures were also generated. RADIATION DOSE REDUCTION: This exam was performed according to the departmental dose-optimization program which includes automated exposure control, adjustment of the mA and/or kV according to patient size and/or use of iterative reconstruction technique. COMPARISON:  None Available. FINDINGS: CT HEAD FINDINGS Brain: Small (2 mm) area of hyperdensity along the anterior left falx (see series 4, 23) appears new from the prior and is concerning for acute subdural hemorrhage. No evidence of acute infarction, hydrocephalus, extra-axial collection or mass lesion/mass effect. Chronic microvascular ischemic disease and atrophy. Vascular: Calcific intracranial atherosclerosis. Skull: Large right forehead contusion without acute calvarial fracture. Other: No mastoid effusions. CT MAXILLOFACIAL FINDINGS Osseous: Significantly motion limited evaluation without obvious fracture. Evaluation of the inferior orbits and midface is particularly limited and nondiagnostic  Orbits:  Motion limited without visible abnormality. Evaluation of the orbits is nondiagnostic inferiorly. Sinuses: Clear. Soft tissues: Large right periorbital/forehead contusion. CT CERVICAL SPINE FINDINGS Alignment: Rotation of C1 on C2. Similar mild degenerative anterolisthesis of C5 on C6. Otherwise, no substantial sagittal subluxation Skull base and vertebrae: Mildly displaced fracture through the right lamina C2 (see series 4, image 17; series 7, image 27). Motion limits assessment with possible nondisplaced fracture extending into the right transverse foramen at C2 (series 4, image 23). Soft tissues and spinal canal: Motion limited without obvious prevertebral fluid or swelling. No visible canal hematoma. Disc levels: Similar multilevel severe degenerative disc disease with multilevel facet and uncovertebral hypertrophy. Resulting varying degrees of neural foraminal stenosis. Upper chest: Visualized lung apices are clear. IMPRESSION: CT cervical spine: 1. Mildly displaced fracture through the right lamina C2. 2. Motion limited study with possible additional fracture extending through the transverse foramen of C2 on the right. Given this finding, recommend CTA to assess for arterial injury. 3. Mild rotation of C1 on C2, which may be positional, but MRI could provide more sensitive evaluation for ligamentous injury if clinically warranted. CT head: 1. Small (2 mm) area of hyperdensity along the anterior left falx appears new from the prior and is concerning for acute subdural hemorrhage. 2. Large right forehead/periorbital contusion without visible acute calvarial fracture 3. Motion limited study. Maxillofacial CT: Significantly motion limited study without obvious evidence of acute fracture. Evaluation of the inferior orbits and midface is particularly limited and nondiagnostic. Given the large right periorbital/forehead contusion, consider follow-up maxillofacial CT to exclude fracture. Findings discussed  with Dr. Rocky Crafts via telephone at 4:04 p.m. Electronically Signed   By: Margaretha Sheffield M.D.   On: 09/05/2021 16:06    EKG: Independently reviewed.  Sinus rhythm, 93 bpm with low voltage.  Assessment/Plan Principal Problem:   Fall Active Problems:   Atrial fibrillation, chronic (HCC)   Essential hypertension   Cellulitis   CKD (chronic kidney disease), stage III ([facility])   Dementia with behavioral disturbance ([facility])   GERD (gastroesophageal reflux disease)   Subdural hematoma ([facility])   Rib fractures   Lumbar transverse process fracture ([facility])   C2 cervical fracture ([place])    Fall with polytrauma -Noted fractures to cervical spine, lumbar spine as well as rib fractures along with subdural hemorrhage -Appreciate trauma service evaluation with no anticipated surgical intervention at this point -Hard c-collar in place -We will require PT/OT evaluation once stable -Ambulates with walker at home -N.p.o. for now and keep on gentle IV fluid  Acute subdural hematoma related to above -Appreciate neurosurgery evaluation -CT angio of neck pending as requested -Need to consider MRI for further evaluation  Possible syncopal episode -Monitor on telemetry -2 D echocardiogram will be ordered -CT angio of neck pending as noted above  Bilateral lower extremity cellulitis -Continue on Ancef -Monitor CBC -Images as noted above  Advanced dementia with behavioral disturbances -Haldol as needed -Delirium precautions  Anemia -Follow CBC and check anemia panel  History of atrial fibrillation -Continue home metoprolol for rate control -Patient is appropriately not on anticoagulation given history of frequent falls  -Currently appears to be in sinus rhythm  Hypertension -Continue home blood pressure agents and monitor closely  CKD stage IIIa -Currently near baseline creatinine levels when compared to lab work 5 years ago -Monitor on current medications  GERD -Continue  Protonix  Anxiety/depression -Continue home Paxil   DVT prophylaxis: SCDs Code Status: DNR Family Communication: Daughter and son-in-law at bedside 5/26 Disposition  Plan: Admit for evaluation of syncope/cellulitis.  Work-up of polytrauma Consults called: Trauma, neurosurgery Admission status:   Severity of Illness: The appropriate patient status for this patient is INPATIENT. Inpatient status is judged to be reasonable and necessary in order to provide the required intensity of service to ensure the patient's safety. The patient's presenting symptoms, physical exam findings, and initial radiographic and laboratory data in the context of their chronic comorbidities is felt to place them at high risk for further clinical deterioration. Furthermore, it is not anticipated that the patient will be medically stable for discharge from the hospital within 2 midnights of admission.   * I certify that at the point of admission it is my clinical judgment that the patient will require inpatient hospital care spanning beyond 2 midnights from the point of admission due to high intensity of service, high risk for further deterioration and high frequency of surveillance required.*   Midge Momon D Tocara Mennen DO [facility]  If 7PM-7AM, please contact night-coverage [email or web address]  09/05/2021, 6:30 PM

## 2021-09-05 NOTE — ED Provider Notes (Signed)
**Jesse [name]** Jesse Jesse Benjamin   CSN: [id number] Arrival date & time: 09/05/21  1333     History  Chief Complaint  Patient presents with   Lytle Michaels    Jesse Jesse Benjamin is a 88 y.o. male.  88 yo M with a chief complaints of fall.  The patient was reportedly trying to go up the stairs and she lost his balance and fell and struck his face.  Complaining mostly of right facial pain.  Patient is very hard of hearing and difficult to obtain any history from.  Almost all of the history is obtained by EMS.  Reportedly has a history of skin cancer with some chronic bleeding to the right ear.   Fall      Home Medications Prior to Admission medications   Medication Sig Start Date End Date Taking? Authorizing Provider  lisinopril-hydrochlorothiazide (PRINZIDE,ZESTORETIC) 20-25 MG tablet Take 1 tablet by mouth daily.    [provider]  METOPROLOL SUCCINATE ER PO Take by mouth.    [provider]  Multiple Vitamin (MULTIVITAMIN WITH MINERALS) TABS tablet Take 1 tablet by mouth daily.    [provider]  omeprazole (PRILOSEC) 20 MG capsule Take 20 mg by mouth daily.    [provider]  PARoxetine (PAXIL) 10 MG tablet Take 10 mg by mouth daily.    [provider]  pravastatin (PRAVACHOL) 10 MG tablet Take 10 mg by mouth daily.    [provider]      Allergies    Patient has no allergy information on record.    Review of Systems   Review of Systems  Physical Exam Updated Vital Signs BP 133/75   Pulse 92   Temp 98.6 F (37 C)   Resp 19   SpO2 96%  Physical Exam Vitals and nursing Jesse Benjamin reviewed.  Constitutional:      Appearance: He is well-developed.  HENT:     Head: Normocephalic.     Comments: Significant bruising to the right side of the face with right periorbital edema.  Unable to visualize the eye. Eyes:     Pupils: Pupils are equal, round, and reactive to light.  Neck:     Vascular: No JVD.   Cardiovascular:     Rate and Rhythm: Normal rate and regular rhythm.     Heart sounds: No murmur heard.   No friction rub. No gallop.  Pulmonary:     Effort: No respiratory distress.     Breath sounds: No wheezing.  Abdominal:     General: There is no distension.     Tenderness: There is abdominal tenderness. There is no guarding or rebound.     Comments: Distended abdomen with diffuse abdominal discomfort.  Musculoskeletal:        General: Normal range of motion.     Cervical back: Normal range of motion and neck supple.  Skin:    Coloration: Skin is not pale.     Findings: No rash.  Neurological:     Mental Status: He is alert and oriented to person, place, and time.  Psychiatric:        Behavior: Behavior normal.    ED Results / Procedures / Treatments   Labs (all labs ordered are listed, but only abnormal results are displayed) Labs Reviewed  CBC - Abnormal; Notable for the following components:      Result Value   WBC 11.8 (*)    RBC 3.43 (*)    Hemoglobin 11.4 (*)  HCT 35.9 (*)    MCV 104.7 (*)    RDW 16.8 (*)    All other components within normal limits  PROTIME-INR - Abnormal; Notable for the following components:   Prothrombin Time 16.6 (*)    INR 1.4 (*)    All other components within normal limits  I-STAT CHEM 8, ED - Abnormal; Notable for the following components:   Creatinine, Ser 1.40 (*)    Glucose, Bld 189 (*)    Calcium, Ion 1.04 (*)    Hemoglobin 11.2 (*)    HCT 33.0 (*)    All other components within normal limits  COMPREHENSIVE METABOLIC PANEL  ETHANOL  URINALYSIS, ROUTINE W REFLEX MICROSCOPIC  LACTIC ACID, PLASMA  CK  SAMPLE TO BLOOD BANK    EKG EKG Interpretation  Date/Time:  Friday Sep 05 2021 14:15:22 EDT Ventricular Rate:  93 PR Interval:    QRS Duration: 99 QT Interval:  397 QTC Calculation: 494 R Axis:   109 Text Interpretation: Right and left arm electrode reversal, interpretation assumes no reversal Accelerated  junctional rhythm Right axis deviation Low voltage, extremity leads Anteroseptal infarct, old No significant change since last tracing Confirmed by Deno Etienne [id number]) on 09/05/2021 3:11:33 PM  Radiology DG Pelvis Portable  Result Date: 09/05/2021 CLINICAL DATA:  Trauma. EXAM: PORTABLE PELVIS 1-2 VIEWS COMPARISON:  None Available. FINDINGS: There is no evidence of pelvic fracture or diastasis. No pelvic bone lesions are seen. IMPRESSION: Negative. Electronically Signed   By: Marijo Conception M.D.   On: 09/05/2021 14:26   DG Chest Port 1 View  Result Date: 09/05/2021 CLINICAL DATA:  Trauma in a 88 year old male. EXAM: PORTABLE CHEST 1 VIEW COMPARISON:  Comparison is made with examination from January 20, 2016. FINDINGS: Lung volumes are low. Heart size accentuated by portable technique, low lung volumes and AP projection. Mediastinal contours and hilar structures grossly stable accounting for these findings compared to previous imaging. No visible pneumothorax. Bibasilar airspace disease RIGHT greater than LEFT, somewhat indistinct on the RIGHT and partially obscuring RIGHT heart border. LEFT eighth and ninth rib fractures may be recent, not seen previously. Mildly indistinct appearance of the eighth rib fracture with more acute appearance of the ninth rib fracture, may be projectional in terms of the difference. IMPRESSION: 1. Low lung volumes with bibasilar airspace disease RIGHT greater than LEFT. Query aspiration or pneumonia on the RIGHT. 2. LEFT sided rib fractures appear mildly displaced and may be recent/acute. Electronically Signed   By: Zetta Bills M.D.   On: 09/05/2021 14:28    Procedures Procedures    Medications Ordered in ED Medications  morphine (PF) 2 MG/ML injection 2 mg (2 mg Intravenous Given 09/05/21 1449)  ondansetron (ZOFRAN) injection 4 mg (4 mg Intravenous Given 09/05/21 1449)    ED Course/ Medical Decision Making/ A&P                           Medical Decision  Making Amount and/or Complexity of Data Reviewed Labs: ordered. Radiology: ordered. ECG/medicine tests: ordered.  Risk Prescription drug management.   88 yo M with a chief complaints of right-sided facial pain after fall.  Difficult to obtain history due to the patient being extremely hard of hearing.  Reportedly lives at home by himself and was going up the stairs and had lost his balance and fell on his face.  Patient has quite a bit of edema there.  I am unable to visualize the  right eye.  Reportedly happened less than an hour ago though difficult to establish timeline we will obtain a CK to assess for rhabdo.  CT of the head down to the pelvis this patient is having significant right-sided chest wall and abdominal discomfort though no signs of bruising.  Will reassess.  Chest x-ray independently interpreted by me with a new infiltrate in the right lower lobe.  Radiology read is possible multiple rib fractures.  Awaiting CT.  No significant anemia.  No significant electrolyte abnormality.  Patient care was signed out to Dr. Gilford Raid, please see her Jesse Benjamin for further details care in the ED.  The patients results and plan were reviewed and discussed.   Any x-rays performed were independently reviewed by myself.   Differential diagnosis were considered with the presenting HPI.  Medications  morphine (PF) 2 MG/ML injection 2 mg (2 mg Intravenous Given 09/05/21 1449)  ondansetron (ZOFRAN) injection 4 mg (4 mg Intravenous Given 09/05/21 1449)    Vitals:   09/05/21 1415 09/05/21 1445  BP: (!) 139/105 133/75  Pulse: 91 92  Resp: 19 19  Temp: 98.6 F (37 C)   SpO2: 99% 96%    Final diagnoses:  Multisystem blunt trauma            Final Clinical Impression(s) / ED Diagnoses Final diagnoses:  Multisystem blunt trauma    Rx / DC Orders ED Discharge Orders     None         Deno Etienne, DO 09/05/21 1524

## 2021-09-05 NOTE — ED Notes (Signed)
Patient transported to CT 

## 2021-09-05 NOTE — Consult Note (Signed)
Reason for Consult: Status post fall Referring Physician: Dr. Barbee Cough Jesse Benjamin is an 88 y.o. male.  HPI: Patient is a 88 year old male, history of A-fib, chronic kidney disease, dementia, hypertension who comes in after a fall down 2 stairs.  In speaking with the patient's daughter who he lives with, she states he was stepping down from a kitchen area to the dining room area down 2 stairs and fell.  He hit his head.  His daughter states there was negative LOC.  She states that his usual mobility is limited to his bedroom.  Patient underwent work-up per EDP.  Patient was found to have injuries as listed below. Neurosurgery was consulted for evaluation and management. Trauma surgery consulted Medicine to admit patient.  Past Medical History:  Diagnosis Date   Atrial fibrillation ([place])    Bronchitis, acute    Cancer ([place])    CKD (chronic kidney disease)    Dementia ([place])    Frequent falls    Hypertension    Palpitations     Past Surgical History:  Procedure Laterality Date   CAROTID BODY TUMOR EXCISION     KNEE SURGERY Right    RECURRENT HERNIA     x 2    History reviewed. No pertinent family history.  Social History:  reports that he has never smoked. He has never used smokeless tobacco. He reports that he does not drink alcohol and does not use drugs.  Allergies: Not on File  Medications: I have reviewed the patient's current medications.  Results for orders placed or performed during the hospital encounter of 09/05/21 (from the past 48 hour(s))  Comprehensive metabolic panel     Status: Abnormal   Collection Time: 09/05/21  2:22 PM  Result Value Ref Range   Sodium 139 135 - 145 mmol/L   Potassium 3.8 3.5 - 5.1 mmol/L   Chloride 105 98 - 111 mmol/L   CO2 23 22 - 32 mmol/L   Glucose, Bld 188 (H) 70 - 99 mg/dL    Comment: Glucose reference range applies only to samples taken after fasting for at least 8 hours.   BUN 21 8 - 23 mg/dL   Creatinine, Ser 1.34 (H) 0.61 -  1.24 mg/dL   Calcium 9.3 8.9 - 10.3 mg/dL   Total Protein 7.1 6.5 - 8.1 g/dL   Albumin 3.4 (L) 3.5 - 5.0 g/dL   AST 28 15 - 41 U/L   ALT 13 0 - 44 U/L   Alkaline Phosphatase 75 38 - 126 U/L   Total Bilirubin 1.9 (H) 0.3 - 1.2 mg/dL   GFR, Estimated 48 (L) >60 mL/min    Comment: (NOTE) Calculated using the CKD-EPI Creatinine Equation (2021)    Anion gap 11 5 - 15    Comment: Performed at [facility] [address], [place], [place] [address]  CBC     Status: Abnormal   Collection Time: 09/05/21  2:22 PM  Result Value Ref Range   WBC 11.8 (H) 4.0 - 10.5 K/uL   RBC 3.43 (L) 4.22 - 5.81 MIL/uL   Hemoglobin 11.4 (L) 13.0 - 17.0 g/dL   HCT 35.9 (L) 39.0 - 52.0 %   MCV 104.7 (H) 80.0 - 100.0 fL   MCH 33.2 26.0 - 34.0 pg   MCHC 31.8 30.0 - 36.0 g/dL   RDW 16.8 (H) 11.5 - 15.5 %   Platelets 238 150 - 400 K/uL   nRBC 0.0 0.0 - 0.2 %  Comment: Performed at [facility], [place] [address] [place], [place] [address]  Ethanol     Status: None   Collection Time: 09/05/21  2:22 PM  Result Value Ref Range   Alcohol, Ethyl (B) <10 <10 mg/dL    Comment: (NOTE) Lowest detectable limit for serum alcohol is 10 mg/dL.  For medical purposes only. Performed at [facility], [place] [address], [place], [place] [address]   Lactic acid, plasma     Status: Abnormal   Collection Time: 09/05/21  2:22 PM  Result Value Ref Range   Lactic Acid, Venous 2.1 (HH) 0.5 - 1.9 mmol/L    Comment: CRITICAL RESULT CALLED TO, READ BACK BY AND VERIFIED WITH: C.YANG,RN '@1550'$  09/05/2021 [email or web address].J Performed at [facility], [place] [address], [place], [place] [address]   Protime-INR     Status: Abnormal   Collection Time: 09/05/21  2:22 PM  Result Value Ref Range   Prothrombin Time 16.6 (H) 11.4 - 15.2 seconds   INR 1.4 (H) 0.8 - 1.2    Comment: (NOTE) INR goal varies based on device and disease states. Performed at [facility], [place] [address], [place], [place] [address]   Sample to  Blood Bank     Status: None   Collection Time: 09/05/21  2:22 PM  Result Value Ref Range   Blood Bank Specimen SAMPLE AVAILABLE FOR TESTING    Sample Expiration      09/06/2021,2359 Performed at [facility], [place] [address] [place], [place] [address]   CK     Status: None   Collection Time: 09/05/21  2:22 PM  Result Value Ref Range   Total CK 160 49 - 397 U/L    Comment: Performed at [facility], [place] [address] [place],  [address]  I-Stat Chem 8, ED     Status: Abnormal   Collection Time: 09/05/21  2:53 PM  Result Value Ref Range   Sodium 138 135 - 145 mmol/L   Potassium 3.7 3.5 - 5.1 mmol/L   Chloride 104 98 - 111 mmol/L   BUN 22 8 - 23 mg/dL   Creatinine, Ser 1.40 (H) 0.61 - 1.24 mg/dL   Glucose, Bld 189 (H) 70 - 99 mg/dL    Comment: Glucose reference range applies only to samples taken after fasting for at least 8 hours.   Calcium, Ion 1.04 (L) 1.15 - 1.40 mmol/L   TCO2 25 22 - 32 mmol/L   Hemoglobin 11.2 (L) 13.0 - 17.0 g/dL   HCT 33.0 (L) 39.0 - 52.0 %    CT HEAD WO CONTRAST  Result Date: 09/05/2021 CLINICAL DATA:  Head trauma, minor (Age >= 65y); Neck trauma (Age >= 65y); Facial trauma, blunt EXAM: CT HEAD WITHOUT CONTRAST CT MAXILLOFACIAL WITHOUT CONTRAST CT CERVICAL SPINE WITHOUT CONTRAST TECHNIQUE: Multidetector CT imaging of the head, cervical spine, and maxillofacial structures were performed using the standard protocol without intravenous contrast. Multiplanar CT image reconstructions of the cervical spine and maxillofacial structures were also generated. RADIATION DOSE REDUCTION: This exam was performed according to the departmental dose-optimization program which includes automated exposure control, adjustment of the mA and/or kV according to patient size and/or use of iterative reconstruction technique. COMPARISON:  None Available. FINDINGS: CT HEAD FINDINGS Brain: Small (2 mm) area of hyperdensity along the anterior left falx (see series 4, 23)  appears new from the prior and is concerning for acute subdural hemorrhage. No evidence of acute infarction, hydrocephalus,  extra-axial collection or mass lesion/mass effect. Chronic microvascular ischemic disease and atrophy. Vascular: Calcific intracranial atherosclerosis. Skull: Large right forehead contusion without acute calvarial fracture. Other: No mastoid effusions. CT MAXILLOFACIAL FINDINGS Osseous: Significantly motion limited evaluation without obvious fracture. Evaluation of the inferior orbits and midface is particularly limited and nondiagnostic Orbits: Motion limited without visible abnormality. Evaluation of the orbits is nondiagnostic inferiorly. Sinuses: Clear. Soft tissues: Large right periorbital/forehead contusion. CT CERVICAL SPINE FINDINGS Alignment: Rotation of C1 on C2. Similar mild degenerative anterolisthesis of C5 on C6. Otherwise, no substantial sagittal subluxation Skull base and vertebrae: Mildly displaced fracture through the right lamina C2 (see series 4, image 17; series 7, image 27). Motion limits assessment with possible nondisplaced fracture extending into the right transverse foramen at C2 (series 4, image 23). Soft tissues and spinal canal: Motion limited without obvious prevertebral fluid or swelling. No visible canal hematoma. Disc levels: Similar multilevel severe degenerative disc disease with multilevel facet and uncovertebral hypertrophy. Resulting varying degrees of neural foraminal stenosis. Upper chest: Visualized lung apices are clear. IMPRESSION: CT cervical spine: 1. Mildly displaced fracture through the right lamina C2. 2. Motion limited study with possible additional fracture extending through the transverse foramen of C2 on the right. Given this finding, recommend CTA to assess for arterial injury. 3. Mild rotation of C1 on C2, which may be positional, but MRI could provide more sensitive evaluation for ligamentous injury if clinically warranted. CT head: 1.  Small (2 mm) area of hyperdensity along the anterior left falx appears new from the prior and is concerning for acute subdural hemorrhage. 2. Large right forehead/periorbital contusion without visible acute calvarial fracture 3. Motion limited study. Maxillofacial CT: Significantly motion limited study without obvious evidence of acute fracture. Evaluation of the inferior orbits and midface is particularly limited and nondiagnostic. Given the large right periorbital/forehead contusion, consider follow-up maxillofacial CT to exclude fracture. Findings discussed with Dr. Rocky Crafts via telephone at 4:04 p.m. Electronically Signed   By: Margaretha Sheffield M.D.   On: 09/05/2021 16:06   CT CERVICAL SPINE WO CONTRAST  Result Date: 09/05/2021 CLINICAL DATA:  Head trauma, minor (Age >= 65y); Neck trauma (Age >= 65y); Facial trauma, blunt EXAM: CT HEAD WITHOUT CONTRAST CT MAXILLOFACIAL WITHOUT CONTRAST CT CERVICAL SPINE WITHOUT CONTRAST TECHNIQUE: Multidetector CT imaging of the head, cervical spine, and maxillofacial structures were performed using the standard protocol without intravenous contrast. Multiplanar CT image reconstructions of the cervical spine and maxillofacial structures were also generated. RADIATION DOSE REDUCTION: This exam was performed according to the departmental dose-optimization program which includes automated exposure control, adjustment of the mA and/or kV according to patient size and/or use of iterative reconstruction technique. COMPARISON:  None Available. FINDINGS: CT HEAD FINDINGS Brain: Small (2 mm) area of hyperdensity along the anterior left falx (see series 4, 23) appears new from the prior and is concerning for acute subdural hemorrhage. No evidence of acute infarction, hydrocephalus, extra-axial collection or mass lesion/mass effect. Chronic microvascular ischemic disease and atrophy. Vascular: Calcific intracranial atherosclerosis. Skull: Large right forehead contusion without acute  calvarial fracture. Other: No mastoid effusions. CT MAXILLOFACIAL FINDINGS Osseous: Significantly motion limited evaluation without obvious fracture. Evaluation of the inferior orbits and midface is particularly limited and nondiagnostic Orbits: Motion limited without visible abnormality. Evaluation of the orbits is nondiagnostic inferiorly. Sinuses: Clear. Soft tissues: Large right periorbital/forehead contusion. CT CERVICAL SPINE FINDINGS Alignment: Rotation of C1 on C2. Similar mild degenerative anterolisthesis of C5 on C6. Otherwise, no substantial sagittal subluxation Skull base  and vertebrae: Mildly displaced fracture through the right lamina C2 (see series 4, image 17; series 7, image 27). Motion limits assessment with possible nondisplaced fracture extending into the right transverse foramen at C2 (series 4, image 23). Soft tissues and spinal canal: Motion limited without obvious prevertebral fluid or swelling. No visible canal hematoma. Disc levels: Similar multilevel severe degenerative disc disease with multilevel facet and uncovertebral hypertrophy. Resulting varying degrees of neural foraminal stenosis. Upper chest: Visualized lung apices are clear. IMPRESSION: CT cervical spine: 1. Mildly displaced fracture through the right lamina C2. 2. Motion limited study with possible additional fracture extending through the transverse foramen of C2 on the right. Given this finding, recommend CTA to assess for arterial injury. 3. Mild rotation of C1 on C2, which may be positional, but MRI could provide more sensitive evaluation for ligamentous injury if clinically warranted. CT head: 1. Small (2 mm) area of hyperdensity along the anterior left falx appears new from the prior and is concerning for acute subdural hemorrhage. 2. Large right forehead/periorbital contusion without visible acute calvarial fracture 3. Motion limited study. Maxillofacial CT: Significantly motion limited study without obvious evidence of  acute fracture. Evaluation of the inferior orbits and midface is particularly limited and nondiagnostic. Given the large right periorbital/forehead contusion, consider follow-up maxillofacial CT to exclude fracture. Findings discussed with Dr. Rocky Crafts via telephone at 4:04 p.m. Electronically Signed   By: Margaretha Sheffield M.D.   On: 09/05/2021 16:06   DG Pelvis Portable  Result Date: 09/05/2021 CLINICAL DATA:  Trauma. EXAM: PORTABLE PELVIS 1-2 VIEWS COMPARISON:  None Available. FINDINGS: There is no evidence of pelvic fracture or diastasis. No pelvic bone lesions are seen. IMPRESSION: Negative. Electronically Signed   By: Marijo Conception M.D.   On: 09/05/2021 14:26   CT CHEST ABDOMEN PELVIS W CONTRAST  Result Date: 09/05/2021 CLINICAL DATA:  Unwitnessed fall. EXAM: CT CHEST, ABDOMEN, AND PELVIS WITH CONTRAST TECHNIQUE: Multidetector CT imaging of the chest, abdomen and pelvis was performed following the standard protocol during bolus administration of intravenous contrast. RADIATION DOSE REDUCTION: This exam was performed according to the departmental dose-optimization program which includes automated exposure control, adjustment of the mA and/or kV according to patient size and/or use of iterative reconstruction technique. CONTRAST:  46m OMNIPAQUE IOHEXOL 300 MG/ML  SOLN COMPARISON:  None Available. FINDINGS: CT CHEST FINDINGS Cardiovascular: Cardiac enlargement. No pericardial effusion. Aortic atherosclerosis and coronary artery calcifications. Mediastinum/Nodes: Soft tissue stranding along the right lower neck is identified which may reflect sequelae of contusion, image 9/3. Thyroid gland, trachea, and esophagus are unremarkable. No enlarged axillary, supraclavicular, mediastinal, or hilar lymph nodes. Lungs/Pleura: Small to moderate right pleural effusion is identified. This is low in attenuation measuring 12 Hounsfield units, image 45/3. Trace left pleural effusion is also noted. Atelectasis and  ground-glass attenuation overlying the right pleural effusion is identified. No pneumothorax or signs of contusion. No suspicious lung nodules. Musculoskeletal: No acute osseous findings. Remote left posterior rib fracture deformities appear healed. No acute rib fractures identified bilaterally. CT ABDOMEN PELVIS FINDINGS Hepatobiliary: No hepatic injury or perihepatic hematoma. Status post cholecystectomy. Pancreas: Unremarkable. No pancreatic ductal dilatation or surrounding inflammatory changes. Spleen: No splenic injury or perisplenic hematoma. Adrenals/Urinary Tract: No adrenal hemorrhage or renal injury identified. Bilateral Bosniak class 1 kidney cysts. No follow-up recommended. Bladder is unremarkable. Stomach/Bowel: Stomach is within normal limits. Sigmoid diverticulosis without signs of acute diverticulitis. No evidence of bowel wall thickening, distention, or inflammatory changes. Vascular/Lymphatic: Aortic atherosclerosis without aneurysm. No signs  of abdominopelvic adenopathy. Reproductive: Prostate gland enlargement. Other: No free fluid or fluid collections within the abdomen or pelvis. There is subcutaneous soft tissue stranding along the lower ventral pelvic wall centered at the level of the pubic symphysis. Small subcutaneous hematoma is noted measuring 1.3 x 2.2 cm. Soft tissue stranding extends towards the base of the penis. Findings may reflect contusion after fall. Musculoskeletal: There are acute fractures involving the right L1 and L2 transverse processes, image 68/3 and image 75/3. There is an age indeterminate compression fracture involving the T12 vertebra with loss of approximately 50% of the vertebral body height. Increased sclerosis within the vertebral body suggests a subacute or chronic fracture. IMPRESSION: 1. Acute fractures involving the right L1 and L2 transverse processes. 2. Subcutaneous soft tissue stranding along the lower ventral pelvic wall centered at the level of the pubic  symphysis with small subcutaneous hematoma. Findings may reflect contusion secondary to fall. 3. Age indeterminate T12 compression fracture with loss of approximately 50% of the vertebral body height. 4. Subcutaneous soft tissue stranding is noted along the right lower neck, which is concerning for soft tissue contusion. 5. Bilateral pleural effusions, right greater than left. 6. No signs of pulmonary contusion or pneumothorax. No evidence for solid organ injury within the abdomen or pelvis. 7. Aortic Atherosclerosis (ICD10-I70.0). Electronically Signed   By: Kerby Moors M.D.   On: 09/05/2021 16:04   DG Chest Port 1 View  Result Date: 09/05/2021 CLINICAL DATA:  Trauma in a 88 year old male. EXAM: PORTABLE CHEST 1 VIEW COMPARISON:  Comparison is made with examination from January 20, 2016. FINDINGS: Lung volumes are low. Heart size accentuated by portable technique, low lung volumes and AP projection. Mediastinal contours and hilar structures grossly stable accounting for these findings compared to previous imaging. No visible pneumothorax. Bibasilar airspace disease RIGHT greater than LEFT, somewhat indistinct on the RIGHT and partially obscuring RIGHT heart border. LEFT eighth and ninth rib fractures may be recent, not seen previously. Mildly indistinct appearance of the eighth rib fracture with more acute appearance of the ninth rib fracture, may be projectional in terms of the difference. IMPRESSION: 1. Low lung volumes with bibasilar airspace disease RIGHT greater than LEFT. Query aspiration or pneumonia on the RIGHT. 2. LEFT sided rib fractures appear mildly displaced and may be recent/acute. Electronically Signed   By: Zetta Bills M.D.   On: 09/05/2021 14:28   CT MAXILLOFACIAL WO CONTRAST  Result Date: 09/05/2021 CLINICAL DATA:  Head trauma, minor (Age >= 65y); Neck trauma (Age >= 65y); Facial trauma, blunt EXAM: CT HEAD WITHOUT CONTRAST CT MAXILLOFACIAL WITHOUT CONTRAST CT CERVICAL SPINE WITHOUT  CONTRAST TECHNIQUE: Multidetector CT imaging of the head, cervical spine, and maxillofacial structures were performed using the standard protocol without intravenous contrast. Multiplanar CT image reconstructions of the cervical spine and maxillofacial structures were also generated. RADIATION DOSE REDUCTION: This exam was performed according to the departmental dose-optimization program which includes automated exposure control, adjustment of the mA and/or kV according to patient size and/or use of iterative reconstruction technique. COMPARISON:  None Available. FINDINGS: CT HEAD FINDINGS Brain: Small (2 mm) area of hyperdensity along the anterior left falx (see series 4, 23) appears new from the prior and is concerning for acute subdural hemorrhage. No evidence of acute infarction, hydrocephalus, extra-axial collection or mass lesion/mass effect. Chronic microvascular ischemic disease and atrophy. Vascular: Calcific intracranial atherosclerosis. Skull: Large right forehead contusion without acute calvarial fracture. Other: No mastoid effusions. CT MAXILLOFACIAL FINDINGS Osseous: Significantly motion limited evaluation  without obvious fracture. Evaluation of the inferior orbits and midface is particularly limited and nondiagnostic Orbits: Motion limited without visible abnormality. Evaluation of the orbits is nondiagnostic inferiorly. Sinuses: Clear. Soft tissues: Large right periorbital/forehead contusion. CT CERVICAL SPINE FINDINGS Alignment: Rotation of C1 on C2. Similar mild degenerative anterolisthesis of C5 on C6. Otherwise, no substantial sagittal subluxation Skull base and vertebrae: Mildly displaced fracture through the right lamina C2 (see series 4, image 17; series 7, image 27). Motion limits assessment with possible nondisplaced fracture extending into the right transverse foramen at C2 (series 4, image 23). Soft tissues and spinal canal: Motion limited without obvious prevertebral fluid or swelling. No  visible canal hematoma. Disc levels: Similar multilevel severe degenerative disc disease with multilevel facet and uncovertebral hypertrophy. Resulting varying degrees of neural foraminal stenosis. Upper chest: Visualized lung apices are clear. IMPRESSION: CT cervical spine: 1. Mildly displaced fracture through the right lamina C2. 2. Motion limited study with possible additional fracture extending through the transverse foramen of C2 on the right. Given this finding, recommend CTA to assess for arterial injury. 3. Mild rotation of C1 on C2, which may be positional, but MRI could provide more sensitive evaluation for ligamentous injury if clinically warranted. CT head: 1. Small (2 mm) area of hyperdensity along the anterior left falx appears new from the prior and is concerning for acute subdural hemorrhage. 2. Large right forehead/periorbital contusion without visible acute calvarial fracture 3. Motion limited study. Maxillofacial CT: Significantly motion limited study without obvious evidence of acute fracture. Evaluation of the inferior orbits and midface is particularly limited and nondiagnostic. Given the large right periorbital/forehead contusion, consider follow-up maxillofacial CT to exclude fracture. Findings discussed with Dr. Rocky Crafts via telephone at 4:04 p.m. Electronically Signed   By: Margaretha Sheffield M.D.   On: 09/05/2021 16:06    Review of Systems  Unable to perform ROS: Dementia  Blood pressure (!) 120/109, pulse 80, temperature 98.6 F (37 C), resp. rate 20, SpO2 95 %. Physical Exam Vitals reviewed.  Constitutional:      General: He is not in acute distress.    Appearance: Normal appearance. He is well-developed. He is not diaphoretic.     Interventions: Cervical collar and nasal cannula in place.  HENT:     Head: Normocephalic. No raccoon eyes, Battle's sign, abrasion, contusion or laceration.      Right Ear: Hearing, tympanic membrane, ear canal and external ear normal. No  laceration, drainage or tenderness. No foreign body. No hemotympanum. Tympanic membrane is not perforated.     Left Ear: Hearing, tympanic membrane, ear canal and external ear normal. No laceration, drainage or tenderness. No foreign body. No hemotympanum. Tympanic membrane is not perforated.     Nose: Nose normal. No nasal deformity or laceration.     Mouth/Throat:     Mouth: No lacerations.     Pharynx: Uvula midline.  Eyes:     General: No scleral icterus.    Conjunctiva/sclera: Conjunctivae normal.     Pupils: Pupils are equal, round, and reactive to light.  Neck:     Thyroid: No thyromegaly.     Vascular: No carotid bruit or JVD.     Trachea: Trachea normal.   Cardiovascular:     Rate and Rhythm: Normal rate and regular rhythm.     Pulses: Normal pulses.     Heart sounds: Normal heart sounds.  Pulmonary:     Effort: Pulmonary effort is normal. No respiratory distress.     Breath sounds:  Normal breath sounds.  Chest:     Chest wall: No tenderness.  Abdominal:     General: Bowel sounds are decreased. There is no distension.     Palpations: Abdomen is soft. Abdomen is not rigid.     Tenderness: There is no abdominal tenderness. There is no guarding or rebound.  Musculoskeletal:        General: No tenderness. Normal range of motion.     Cervical back: Spinous process tenderness present.  Lymphadenopathy:     Cervical: No cervical adenopathy.  Skin:    General: Skin is warm and dry.       Neurological:     Mental Status: He is alert and oriented to person, place, and time.     GCS: GCS eye subscore is 4. GCS verbal subscore is 5. GCS motor subscore is 6.     Cranial Nerves: No cranial nerve deficit.     Sensory: No sensory deficit.  Psychiatric:        Speech: Speech normal.        Behavior: Behavior normal. Behavior is cooperative.    Assessment/Plan: 88 year old male status post ground-level fall. Right L1-L2 TP fracture Age-indeterminate T12 compression  fracture Fracture through C2 right lamina and possible transverse foramen Subdural hematoma Right forehead contusion  History of A-fib Hypertension Chronic kidney disease  1.  Patient will be admitted by medicine for multiple medical issues 2.  Neurosurgery to evaluate patient for neurosurgical injuries.  Patient undergo CTA secondary to C2 fracture. 3.  Wound care to skin tears.  Ralene Ok 09/05/2021, 5:28 PM

## 2021-09-05 NOTE — ED Provider Notes (Addendum)
Pt was signed out by Dr. Tyrone Nine pending CT scans.  Pt's family is here now and they were able to give me a hx.  Pt has dementia.  Normally, he is pretty calm, but has been much more agitated lately.  He normally does not move much out of his recliner.  Pt's daughter and husband went out to lunch.  When they came back, he was on the ground on a wood floor.   CT head/face:    CT head:     1. Small (2 mm) area of hyperdensity along the anterior left falx  appears new from the prior and is concerning for acute subdural  hemorrhage.  2. Large right forehead/periorbital contusion without visible acute  calvarial fracture  3. Motion limited study.     Maxillofacial CT:     Significantly motion limited study without obvious evidence of acute  fracture. Evaluation of the inferior orbits and midface is  particularly limited and nondiagnostic. Given the large right  periorbital/forehead contusion, consider follow-up maxillofacial CT  to exclude fracture.   CT cervical spine:   IMPRESSION:  CT cervical spine:     1. Mildly displaced fracture through the right lamina C2.  2. Motion limited study with possible additional fracture extending  through the transverse foramen of C2 on the right. Given this  finding, recommend CTA to assess for arterial injury.  3. Mild rotation of C1 on C2, which may be positional, but MRI could  provide more sensitive evaluation for ligamentous injury if  clinically warranted.      CT chest/abd/pelvis:   IMPRESSION:  1. Acute fractures involving the right L1 and L2 transverse  processes.  2. Subcutaneous soft tissue stranding along the lower ventral pelvic  wall centered at the level of the pubic symphysis with small  subcutaneous hematoma. Findings may reflect contusion secondary to  fall.  3. Age indeterminate T12 compression fracture with loss of  approximately 50% of the vertebral body height.  4. Subcutaneous soft tissue stranding is noted along  the right lower  neck, which is concerning for soft tissue contusion.  5. Bilateral pleural effusions, right greater than left.  6. No signs of pulmonary contusion or pneumothorax. No evidence for  solid organ injury within the abdomen or pelvis.  7. Aortic Atherosclerosis (ICD10-I70.0).   Pt placed in a c-collar.  CT angio ordered, but is pending upon admission.  Pt d/w Dr. Bobbye Morton (trauma) for his trauma injuries.  She contacted Dr. Rosendo Gros who did see pt in consult.  I also noticed pt's legs had some chronic skin changes.  There is also redness going up both legs which are new according to the family.  Pt started on IV Ancef.  Pt d/w Dr. Manuella Ghazi (triad) for admission.  Pt d/w Dr. Zada Finders (NS) who will see pt in consult.  CRITICAL CARE Performed by: Isla Pence   Total critical care time: 30 minutes  Critical care time was exclusive of separately billable procedures and treating other patients.  Critical care was necessary to treat or prevent imminent or life-threatening deterioration.  Critical care was time spent personally by me on the following activities: development of treatment plan with patient and/or surrogate as well as nursing, discussions with consultants, evaluation of patient's response to treatment, examination of patient, obtaining history from patient or surrogate, ordering and performing treatments and interventions, ordering and review of laboratory studies, ordering and review of radiographic studies, pulse oximetry and re-evaluation of patient's condition.  Isla Pence, MD 09/05/21 1730    Isla Pence, MD 09/05/21 [id number]

## 2021-09-05 NOTE — TOC CAGE-AID Note (Signed)
Transition of Care [facility]) - CAGE-AID Screening   Patient Details  Name: Jesse Benjamin MRN: [id number] Date of Birth: 1920/09/12  [facility] of [facility] CM/SW Contact:    Clovis Cao, RN Phone Number: 09/05/2021, 7:04 PM   Clinical Narrative: Pt presents to ED after sustaining a fall.  Pt has advanced dementia and is unable to participate in screening.   CAGE-AID Screening: Substance Abuse Screening unable to be completed due to: : Patient unable to participate

## 2021-09-05 NOTE — Consult Note (Signed)
Neurosurgery Consultation  Reason for Consult: Fall/TBI/C-spine injury Referring Physician: Manuella Ghazi  CC: Fall  HPI: This is a 88 y.o. man that presents after a reported fall. He is in mitts, quite agitated about the mitts, refuses to answer any questions I ask unless I take the mitts off. Rest of history is obviously limited due to his refusal.    ROS: A 14 point ROS was performed and is negative except as noted in the HPI, but unable to assess as he says no to everything unless I agree to take his mitts off  PMHx:  Past Medical History:  Diagnosis Date   Atrial fibrillation (HCC)    Bronchitis, acute    Cancer ([place])    CKD (chronic kidney disease)    Dementia ([place])    Frequent falls    Hypertension    Palpitations    FamHx: History reviewed. No pertinent family history. SocHx:  reports that he has never smoked. He has never used smokeless tobacco. He reports that he does not drink alcohol and does not use drugs.  Exam: Vital signs in last 24 hours: Temp:  [96.8 F (36 C)-98.6 F (37 C)] 96.8 F (36 C) (05/26 1912) Pulse Rate:  [80-92] 83 (05/26 1912) Resp:  [14-20] 15 (05/26 1912) BP: (120-152)/(65-109) 135/65 (05/26 2010) SpO2:  [93 %-99 %] 94 % (05/26 1745) General: Awake, alert, lying in hospital bed Head: R orbital ecchymosis and swollen shut OD, otherwise normocephalic  HEENT: In well-fitted cervical collar Pulmonary: breathing room air comfortably, no evidence of increased work of breathing Cardiac: mildly tachycardic Psych: agitated, refusing to cooperate with exam Extremities: BLE with diffuse rash and edema Neuro: Awake/alert, refuses to answer orientation questions, trying to bargain to take off his mitts, not followign commands but withdraws x4, R eye swollen shut, left with 43m reactive pupil with neutral position, BLE will wiggle toes to command but only once  Assessment and Plan: 88y.o. man [place]. [place] C-spine personally reviewed, which show right C2  laminar frx traveling into the F transversarium, CTH with small L parafalcine aSDH.  -no acute neurosurgical intervention indicated at this time -CTA neg -C-collar for the fracture if he'll tolerate it, non-operative -SDH is non-operative, no f/u study needed, okay for DVT chemoprophylaxis at 48 hours post-trauma -please call with any concerns or questions  TJudith Part MD 09/05/21 10:33 PM [facility]

## 2021-09-05 NOTE — ED Triage Notes (Addendum)
Pt BIB [facility] from home. Pt had an unwitnessed mechanical fall. Per EMS, pt was home alone, fell off the stairs, landed on his face. At baseline, pt either walks w a walker or a cane, but none of them was near him upon EMS arrival. Large hematoma on the R forehead. Skin tear on R elbow.Unknown LOC. Not on blood thinner.  Pt is very hard of hearing. A&O x4.   BP 190/96 HR 93  94%RA CBG 161

## 2021-09-06 ENCOUNTER — Inpatient Hospital Stay (HOSPITAL_COMMUNITY): Payer: Medicare Other

## 2021-09-06 DIAGNOSIS — R55 Syncope and collapse: Secondary | ICD-10-CM | POA: Diagnosis not present

## 2021-09-06 DIAGNOSIS — W19XXXD Unspecified fall, subsequent encounter: Secondary | ICD-10-CM | POA: Diagnosis not present

## 2021-09-06 DIAGNOSIS — L03119 Cellulitis of unspecified part of limb: Secondary | ICD-10-CM | POA: Diagnosis not present

## 2021-09-06 DIAGNOSIS — Z515 Encounter for palliative care: Secondary | ICD-10-CM | POA: Diagnosis not present

## 2021-09-06 DIAGNOSIS — S12101A Unspecified nondisplaced fracture of second cervical vertebra, initial encounter for closed fracture: Secondary | ICD-10-CM

## 2021-09-06 DIAGNOSIS — K219 Gastro-esophageal reflux disease without esophagitis: Secondary | ICD-10-CM

## 2021-09-06 DIAGNOSIS — F0390 Unspecified dementia without behavioral disturbance: Secondary | ICD-10-CM

## 2021-09-06 DIAGNOSIS — I482 Chronic atrial fibrillation, unspecified: Secondary | ICD-10-CM | POA: Diagnosis not present

## 2021-09-06 DIAGNOSIS — N1831 Chronic kidney disease, stage 3a: Secondary | ICD-10-CM

## 2021-09-06 DIAGNOSIS — I10 Essential (primary) hypertension: Secondary | ICD-10-CM

## 2021-09-06 DIAGNOSIS — Z7189 Other specified counseling: Secondary | ICD-10-CM | POA: Diagnosis not present

## 2021-09-06 DIAGNOSIS — F03918 Unspecified dementia, unspecified severity, with other behavioral disturbance: Secondary | ICD-10-CM

## 2021-09-06 DIAGNOSIS — W19XXXA Unspecified fall, initial encounter: Secondary | ICD-10-CM | POA: Diagnosis not present

## 2021-09-06 LAB — URINALYSIS, ROUTINE W REFLEX MICROSCOPIC
Bacteria, UA: NONE SEEN
Bilirubin Urine: NEGATIVE
Glucose, UA: NEGATIVE mg/dL
Ketones, ur: NEGATIVE mg/dL
Leukocytes,Ua: NEGATIVE
Nitrite: NEGATIVE
Protein, ur: 100 mg/dL — AB
Specific Gravity, Urine: >1.046 — ABNORMAL HIGH (ref 1.005–1.030)
pH: 5 (ref 5.0–8.0)

## 2021-09-06 LAB — CBC
HCT: 33.2 % — ABNORMAL LOW (ref 39.0–52.0)
Hemoglobin: 10.5 g/dL — ABNORMAL LOW (ref 13.0–17.0)
MCH: 31.9 pg (ref 26.0–34.0)
MCHC: 31.6 g/dL (ref 30.0–36.0)
MCV: 100.9 fL — ABNORMAL HIGH (ref 80.0–100.0)
Platelets: 256 10*3/uL (ref 150–400)
RBC: 3.29 MIL/uL — ABNORMAL LOW (ref 4.22–5.81)
RDW: 16.8 % — ABNORMAL HIGH (ref 11.5–15.5)
WBC: 11.7 10*3/uL — ABNORMAL HIGH (ref 4.0–10.5)
nRBC: 0 % (ref 0.0–0.2)

## 2021-09-06 LAB — ECHOCARDIOGRAM COMPLETE
AR max vel: 2.23 cm2
AV Area VTI: 2.07 cm2
AV Area mean vel: 2.16 cm2
AV Mean grad: 1 mmHg
AV Peak grad: 2.5 mmHg
Ao pk vel: 0.79 m/s
S' Lateral: 2.6 cm

## 2021-09-06 LAB — COMPREHENSIVE METABOLIC PANEL WITH GFR
ALT: 13 U/L (ref 0–44)
AST: 37 U/L (ref 15–41)
Albumin: 3.2 g/dL — ABNORMAL LOW (ref 3.5–5.0)
Alkaline Phosphatase: 73 U/L (ref 38–126)
Anion gap: 10 (ref 5–15)
BUN: 20 mg/dL (ref 8–23)
CO2: 24 mmol/L (ref 22–32)
Calcium: 9.1 mg/dL (ref 8.9–10.3)
Chloride: 103 mmol/L (ref 98–111)
Creatinine, Ser: 1.37 mg/dL — ABNORMAL HIGH (ref 0.61–1.24)
GFR, Estimated: 47 mL/min — ABNORMAL LOW
Glucose, Bld: 126 mg/dL — ABNORMAL HIGH (ref 70–99)
Potassium: 4.4 mmol/L (ref 3.5–5.1)
Sodium: 137 mmol/L (ref 135–145)
Total Bilirubin: 2.1 mg/dL — ABNORMAL HIGH (ref 0.3–1.2)
Total Protein: 6.9 g/dL (ref 6.5–8.1)

## 2021-09-06 LAB — MAGNESIUM: Magnesium: 1.8 mg/dL (ref 1.7–2.4)

## 2021-09-06 MED ORDER — ORAL CARE MOUTH RINSE
15.0000 mL | Freq: Two times a day (BID) | OROMUCOSAL | Status: DC
  Administered 2021-09-07 – 2021-09-09 (×5): 15 mL via OROMUCOSAL

## 2021-09-06 MED ORDER — PERFLUTREN LIPID MICROSPHERE
1.0000 mL | INTRAVENOUS | Status: AC | PRN
  Administered 2021-09-06: 4 mL via INTRAVENOUS

## 2021-09-06 NOTE — Progress Notes (Signed)
PROGRESS NOTE    Jesse Benjamin  DZH:[id number] DOB: 10-26-1920 DOA: 09/05/2021 PCP: London Pepper, MD   Brief Narrative:  Jesse Benjamin is a 88 y.o. male with medical history significant for hypertension, atrial fibrillation, CKD stage III, advanced dementia, frequent falls, and GERD.  He was brought to the ED after he was found down at home after his daughter and her husband returned home after having lunch.   ED Course: Vital signs stable with some mild blood pressure elevations noted.  Mild leukocytosis of 11,800 and hemoglobin 11.2.  Creatinine appears to be at baseline at 1.4 and lactic acid is 1.2.  Multiple imaging studies performed with noted C2 lamina fracture as well as L1 and L2 transverse process fractures as well as left-sided rib fractures.  Acute subdural hematoma also present on CT scan.  Trauma surgery has been contacted for further evaluation and patient is currently in c-collar.  Neurosurgery has also been contacted for further evaluation.  He started on Ancef for bilateral lower extremity cellulitis.  Assessment & Plan:  Fall-initial encounter -Noted fractures to cervical spine, lumbar spine as well as rib fractures along with subdural hemorrhage -Appreciate trauma service evaluation with no anticipated surgical intervention at this point-signed off -Hard c-collar in place -We will keep him n.p.o. for now.   -Consult PT/OT/SLP    Acute subdural hematoma: Right periorbital edema and hematoma: -Appreciate neurosurgery evaluation.  CTA negative -Neurosurgery recommended SDH is nonoperative, no follow-up study needed.  Okay for DVT chemoprophylaxis at 48 hours posttrauma -Neurosurgery signed off   Possible syncopal episode -Monitor on telemetry -2 D echocardiogram will be ordered-pending   Bilateral lower extremity cellulitis -Continue on Ancef -He is afebrile with leukocytosis of 11.7 -Blood culture is pending  History of dementia without behavioral  disturbances: -Haldol as needed -Delirium precautions   Macrocytic anemia: -H&H is stable.  Normal B12, folate, iron panel   History of atrial fibrillation -On metoprolol at home.  Currently in sinus rhythm.   Hypertension -Patient is currently stable.  Hold p.o. antihypertensive at this time as patient is NPO. -Await SLP evaluation   CKD stage IIIa -Currently near baseline creatinine levels when compared to lab work 5 years ago -Monitor on current medications   GERD -Continue Protonix   Anxiety/depression -Continue home Paxil  Goals of care: Discussed with patient's daughter on the phone-family is more leaning forward to hospice.  Palliative care consulted.  DVT prophylaxis: SCD Code Status: DNR Family Communication:  None present at bedside.  Plan of care discussed with patient in length and he verbalized understanding and agreed with it. I called patient's daughter and discussed plan of care and she verbalized understanding Disposition Plan: To be determined  Consultants:  Trauma surgery Neurosurgery Palliative care  Procedures:  None  Antimicrobials:  Ancef  Status is: Inpatient      Subjective: Patient seen and examined.  He opened his eyes with verbal command.  Has mittens in his hand.  Has large right forehead hematoma and periorbital swelling.  As per nurse: No acute events overnight.  Remained afebrile.  Objective: Vitals:   09/05/21 2354 09/06/21 0309 09/06/21 0714 09/06/21 0942  BP:  123/72 (!) 144/84 136/78  Pulse: 84  90 88  Resp: '14  15 18  '$ Temp: 98 F (36.7 C) 98.8 F (37.1 C) (!) 97.5 F (36.4 C)   TempSrc: Axillary Axillary Axillary   SpO2:   95% 98%    Intake/Output Summary (Last 24 hours) at 09/06/2021 1049 Last data  filed at 09/06/2021 0400 Gross per 24 hour  Intake 468.09 ml  Output --  Net 468.09 ml   There were no vitals filed for this visit.  Examination:  General exam: Appears calm and comfortable, has nasal  cannula Respiratory system: Clear to auscultation. Respiratory effort normal. Cardiovascular system: S1 & S2 heard, RRR. No JVD, murmurs, rubs, gallops or clicks. No pedal edema. Gastrointestinal system: Abdomen is nondistended, soft and nontender. No organomegaly or masses felt. Normal bowel sounds heard. Central nervous system: Sleepy but arousable with verbal command.  Extremities: Move all 4 extremities appropriately Skin: Has large right-sided periorbital hematoma Bilateral lower extremities: Erythematous, dry thick skin plaques noted in bilateral lower extremities. Psychiatry: Unable to assess at this time   Data Reviewed: I have personally reviewed following labs and imaging studies  CBC: Recent Labs  Lab 09/05/21 1422 09/05/21 1453 09/06/21 0255  WBC 11.8*  --  11.7*  HGB 11.4* 11.2* 10.5*  HCT 35.9* 33.0* 33.2*  MCV 104.7*  --  100.9*  PLT 238  --  086   Basic Metabolic Panel: Recent Labs  Lab 09/05/21 1422 09/05/21 1453 09/06/21 0255  NA 139 138 137  K 3.8 3.7 4.4  CL 105 104 103  CO2 23  --  24  GLUCOSE 188* 189* 126*  BUN '21 22 20  '$ CREATININE 1.34* 1.40* 1.37*  CALCIUM 9.3  --  9.1  MG  --   --  1.8   GFR: CrCl cannot be calculated (Unknown ideal weight.). Liver Function Tests: Recent Labs  Lab 09/05/21 1422 09/06/21 0255  AST 28 37  ALT 13 13  ALKPHOS 75 73  BILITOT 1.9* 2.1*  PROT 7.1 6.9  ALBUMIN 3.4* 3.2*   No results for input(s): LIPASE, AMYLASE in the last 168 hours. No results for input(s): AMMONIA in the last 168 hours. Coagulation Profile: Recent Labs  Lab 09/05/21 1422  INR 1.4*   Cardiac Enzymes: Recent Labs  Lab 09/05/21 1422  CKTOTAL 160   BNP (last 3 results) No results for input(s): PROBNP in the last 8760 hours. HbA1C: No results for input(s): HGBA1C in the last 72 hours. CBG: No results for input(s): GLUCAP in the last 168 hours. Lipid Profile: No results for input(s): CHOL, HDL, LDLCALC, TRIG, CHOLHDL, LDLDIRECT  in the last 72 hours. Thyroid Function Tests: No results for input(s): TSH, T4TOTAL, FREET4, T3FREE, THYROIDAB in the last 72 hours. Anemia Panel: Recent Labs    09/05/21 2021  VITAMINB12 668  FOLATE >40.0  FERRITIN 38  TIBC 374  IRON 58  RETICCTPCT 4.2*   Sepsis Labs: Recent Labs  Lab 09/05/21 1422  LATICACIDVEN 2.1*    Recent Results (from the past 240 hour(s))  Culture, blood (routine x 2)     Status: None (Preliminary result)   Collection Time: 09/05/21  5:49 PM   Specimen: BLOOD  Result Value Ref Range Status   Specimen Description BLOOD BLOOD LEFT HAND  Final   Special Requests   Final    BOTTLES DRAWN AEROBIC AND ANAEROBIC Blood Culture adequate volume   Culture   Final    NO GROWTH < 24 HOURS Performed at [facility], [place] [address], [place], [place] [address]    Report Status PENDING  Incomplete  Culture, blood (routine x 2)     Status: None (Preliminary result)   Collection Time: 09/05/21  8:21 PM   Specimen: BLOOD  Result Value Ref Range Status   Specimen Description BLOOD LEFT ANTECUBITAL  Final   Special Requests   Final    BOTTLES DRAWN AEROBIC AND ANAEROBIC Blood Culture results may not be optimal due to an inadequate volume of blood received in culture bottles   Culture   Final    NO GROWTH < 12 HOURS Performed at [facility] [address], [place], [place] [address]    Report Status PENDING  Incomplete      Radiology Studies: CT HEAD WO CONTRAST  Result Date: 09/05/2021 CLINICAL DATA:  Head trauma, minor (Age >= 65y); Neck trauma (Age >= 65y); Facial trauma, blunt EXAM: CT HEAD WITHOUT CONTRAST CT MAXILLOFACIAL WITHOUT CONTRAST CT CERVICAL SPINE WITHOUT CONTRAST TECHNIQUE: Multidetector CT imaging of the head, cervical spine, and maxillofacial structures were performed using the standard protocol without intravenous contrast. Multiplanar CT image reconstructions of the cervical spine and maxillofacial structures were also generated.  RADIATION DOSE REDUCTION: This exam was performed according to the departmental dose-optimization program which includes automated exposure control, adjustment of the mA and/or kV according to patient size and/or use of iterative reconstruction technique. COMPARISON:  None Available. FINDINGS: CT HEAD FINDINGS Brain: Small (2 mm) area of hyperdensity along the anterior left falx (see series 4, 23) appears new from the prior and is concerning for acute subdural hemorrhage. No evidence of acute infarction, hydrocephalus, extra-axial collection or mass lesion/mass effect. Chronic microvascular ischemic disease and atrophy. Vascular: Calcific intracranial atherosclerosis. Skull: Large right forehead contusion without acute calvarial fracture. Other: No mastoid effusions. CT MAXILLOFACIAL FINDINGS Osseous: Significantly motion limited evaluation without obvious fracture. Evaluation of the inferior orbits and midface is particularly limited and nondiagnostic Orbits: Motion limited without visible abnormality. Evaluation of the orbits is nondiagnostic inferiorly. Sinuses: Clear. Soft tissues: Large right periorbital/forehead contusion. CT CERVICAL SPINE FINDINGS Alignment: Rotation of C1 on C2. Similar mild degenerative anterolisthesis of C5 on C6. Otherwise, no substantial sagittal subluxation Skull base and vertebrae: Mildly displaced fracture through the right lamina C2 (see series 4, image 17; series 7, image 27). Motion limits assessment with possible nondisplaced fracture extending into the right transverse foramen at C2 (series 4, image 23). Soft tissues and spinal canal: Motion limited without obvious prevertebral fluid or swelling. No visible canal hematoma. Disc levels: Similar multilevel severe degenerative disc disease with multilevel facet and uncovertebral hypertrophy. Resulting varying degrees of neural foraminal stenosis. Upper chest: Visualized lung apices are clear. IMPRESSION: CT cervical spine: 1.  Mildly displaced fracture through the right lamina C2. 2. Motion limited study with possible additional fracture extending through the transverse foramen of C2 on the right. Given this finding, recommend CTA to assess for arterial injury. 3. Mild rotation of C1 on C2, which may be positional, but MRI could provide more sensitive evaluation for ligamentous injury if clinically warranted. CT head: 1. Small (2 mm) area of hyperdensity along the anterior left falx appears new from the prior and is concerning for acute subdural hemorrhage. 2. Large right forehead/periorbital contusion without visible acute calvarial fracture 3. Motion limited study. Maxillofacial CT: Significantly motion limited study without obvious evidence of acute fracture. Evaluation of the inferior orbits and midface is particularly limited and nondiagnostic. Given the large right periorbital/forehead contusion, consider follow-up maxillofacial CT to exclude fracture. Findings discussed with Dr. Rocky Crafts via telephone at 4:04 p.m. Electronically Signed   By: Margaretha Sheffield M.D.   On: 09/05/2021 16:06   CT Angio Neck W and/or Wo Contrast  Result Date: 09/05/2021 CLINICAL DATA:  Neck trauma, arterial injury suspected EXAM: CT ANGIOGRAPHY NECK  TECHNIQUE: Multidetector CT imaging of the neck was performed using the standard protocol during bolus administration of intravenous contrast. Multiplanar CT image reconstructions and MIPs were obtained to evaluate the vascular anatomy. Carotid stenosis measurements (when applicable) are obtained utilizing NASCET criteria, using the distal internal carotid diameter as the denominator. RADIATION DOSE REDUCTION: This exam was performed according to the departmental dose-optimization program which includes automated exposure control, adjustment of the mA and/or kV according to patient size and/or use of iterative reconstruction technique. CONTRAST:  19m OMNIPAQUE IOHEXOL 350 MG/ML SOLN COMPARISON:  No prior  CTA of the neck, correlation is made with CT head and CT cervical spine 09/05/2021 FINDINGS: Aortic arch: Standard branching. Imaged portion shows no evidence of aneurysm or dissection. No significant stenosis of the major arch vessel origins. Aortic atherosclerosis. Noncalcified plaque in the proximal left subclavian artery causes just below 50% stenosis. Right carotid system: No evidence of dissection, occlusion, or hemodynamically significant stenosis (greater than 50%). Calcifications of the bifurcation and in the proximal right ICA are not hemodynamically significant. Left carotid system: No evidence of dissection, occlusion, or hemodynamically significant stenosis (greater than 50%). Calcifications at the bifurcation and in the proximal left ICA and ECA are not hemodynamically significant. Vertebral arteries: Diminutive right vertebral artery, without evidence of arterial injury as it passes through the right C2 vertebral artery foramen. The right vertebral artery is patent to the vertebrobasilar junction without evidence of dissection, significant stenosis, or occlusion. Moderate narrowing at the origin of the left vertebral artery. The left vertebral artery is otherwise patent to the vertebrobasilar junction without evidence of dissection, significant stenosis, or occlusion. Skeleton: For cervical spine findings, please see same day CT cervical spine Other neck: Stranding and possible hematoma in the lower right sternocleidomastoid muscle surrounding soft tissues, likely posttraumatic. No active extravasation into the hematoma. Upper chest: Please see same-day CT chest abdomen pelvis. IMPRESSION: 1. No evidence of vascular injury. Moderate narrowing at the origin of the left vertebral artery. No other hemodynamically significant stenosis in the neck. 2. For additional head and cervical spine findings, including known C2 fracture, please see same-day CT head and cervical spine. Electronically Signed   By:  AMerilyn BabaM.D.   On: 09/05/2021 19:20   CT CERVICAL SPINE WO CONTRAST  Result Date: 09/05/2021 CLINICAL DATA:  Head trauma, minor (Age >= 65y); Neck trauma (Age >= 65y); Facial trauma, blunt EXAM: CT HEAD WITHOUT CONTRAST CT MAXILLOFACIAL WITHOUT CONTRAST CT CERVICAL SPINE WITHOUT CONTRAST TECHNIQUE: Multidetector CT imaging of the head, cervical spine, and maxillofacial structures were performed using the standard protocol without intravenous contrast. Multiplanar CT image reconstructions of the cervical spine and maxillofacial structures were also generated. RADIATION DOSE REDUCTION: This exam was performed according to the departmental dose-optimization program which includes automated exposure control, adjustment of the mA and/or kV according to patient size and/or use of iterative reconstruction technique. COMPARISON:  None Available. FINDINGS: CT HEAD FINDINGS Brain: Small (2 mm) area of hyperdensity along the anterior left falx (see series 4, 23) appears new from the prior and is concerning for acute subdural hemorrhage. No evidence of acute infarction, hydrocephalus, extra-axial collection or mass lesion/mass effect. Chronic microvascular ischemic disease and atrophy. Vascular: Calcific intracranial atherosclerosis. Skull: Large right forehead contusion without acute calvarial fracture. Other: No mastoid effusions. CT MAXILLOFACIAL FINDINGS Osseous: Significantly motion limited evaluation without obvious fracture. Evaluation of the inferior orbits and midface is particularly limited and nondiagnostic Orbits: Motion limited without visible abnormality. Evaluation of the orbits is nondiagnostic  inferiorly. Sinuses: Clear. Soft tissues: Large right periorbital/forehead contusion. CT CERVICAL SPINE FINDINGS Alignment: Rotation of C1 on C2. Similar mild degenerative anterolisthesis of C5 on C6. Otherwise, no substantial sagittal subluxation Skull base and vertebrae: Mildly displaced fracture through the  right lamina C2 (see series 4, image 17; series 7, image 27). Motion limits assessment with possible nondisplaced fracture extending into the right transverse foramen at C2 (series 4, image 23). Soft tissues and spinal canal: Motion limited without obvious prevertebral fluid or swelling. No visible canal hematoma. Disc levels: Similar multilevel severe degenerative disc disease with multilevel facet and uncovertebral hypertrophy. Resulting varying degrees of neural foraminal stenosis. Upper chest: Visualized lung apices are clear. IMPRESSION: CT cervical spine: 1. Mildly displaced fracture through the right lamina C2. 2. Motion limited study with possible additional fracture extending through the transverse foramen of C2 on the right. Given this finding, recommend CTA to assess for arterial injury. 3. Mild rotation of C1 on C2, which may be positional, but MRI could provide more sensitive evaluation for ligamentous injury if clinically warranted. CT head: 1. Small (2 mm) area of hyperdensity along the anterior left falx appears new from the prior and is concerning for acute subdural hemorrhage. 2. Large right forehead/periorbital contusion without visible acute calvarial fracture 3. Motion limited study. Maxillofacial CT: Significantly motion limited study without obvious evidence of acute fracture. Evaluation of the inferior orbits and midface is particularly limited and nondiagnostic. Given the large right periorbital/forehead contusion, consider follow-up maxillofacial CT to exclude fracture. Findings discussed with Dr. Rocky Crafts via telephone at 4:04 p.m. Electronically Signed   By: Margaretha Sheffield M.D.   On: 09/05/2021 16:06   DG Pelvis Portable  Result Date: 09/05/2021 CLINICAL DATA:  Trauma. EXAM: PORTABLE PELVIS 1-2 VIEWS COMPARISON:  None Available. FINDINGS: There is no evidence of pelvic fracture or diastasis. No pelvic bone lesions are seen. IMPRESSION: Negative. Electronically Signed   By: Marijo Conception M.D.   On: 09/05/2021 14:26   CT CHEST ABDOMEN PELVIS W CONTRAST  Result Date: 09/05/2021 CLINICAL DATA:  Unwitnessed fall. EXAM: CT CHEST, ABDOMEN, AND PELVIS WITH CONTRAST TECHNIQUE: Multidetector CT imaging of the chest, abdomen and pelvis was performed following the standard protocol during bolus administration of intravenous contrast. RADIATION DOSE REDUCTION: This exam was performed according to the departmental dose-optimization program which includes automated exposure control, adjustment of the mA and/or kV according to patient size and/or use of iterative reconstruction technique. CONTRAST:  30m OMNIPAQUE IOHEXOL 300 MG/ML  SOLN COMPARISON:  None Available. FINDINGS: CT CHEST FINDINGS Cardiovascular: Cardiac enlargement. No pericardial effusion. Aortic atherosclerosis and coronary artery calcifications. Mediastinum/Nodes: Soft tissue stranding along the right lower neck is identified which may reflect sequelae of contusion, image 9/3. Thyroid gland, trachea, and esophagus are unremarkable. No enlarged axillary, supraclavicular, mediastinal, or hilar lymph nodes. Lungs/Pleura: Small to moderate right pleural effusion is identified. This is low in attenuation measuring 12 Hounsfield units, image 45/3. Trace left pleural effusion is also noted. Atelectasis and ground-glass attenuation overlying the right pleural effusion is identified. No pneumothorax or signs of contusion. No suspicious lung nodules. Musculoskeletal: No acute osseous findings. Remote left posterior rib fracture deformities appear healed. No acute rib fractures identified bilaterally. CT ABDOMEN PELVIS FINDINGS Hepatobiliary: No hepatic injury or perihepatic hematoma. Status post cholecystectomy. Pancreas: Unremarkable. No pancreatic ductal dilatation or surrounding inflammatory changes. Spleen: No splenic injury or perisplenic hematoma. Adrenals/Urinary Tract: No adrenal hemorrhage or renal injury identified. Bilateral Bosniak  class 1 kidney cysts. No  follow-up recommended. Bladder is unremarkable. Stomach/Bowel: Stomach is within normal limits. Sigmoid diverticulosis without signs of acute diverticulitis. No evidence of bowel wall thickening, distention, or inflammatory changes. Vascular/Lymphatic: Aortic atherosclerosis without aneurysm. No signs of abdominopelvic adenopathy. Reproductive: Prostate gland enlargement. Other: No free fluid or fluid collections within the abdomen or pelvis. There is subcutaneous soft tissue stranding along the lower ventral pelvic wall centered at the level of the pubic symphysis. Small subcutaneous hematoma is noted measuring 1.3 x 2.2 cm. Soft tissue stranding extends towards the base of the penis. Findings may reflect contusion after fall. Musculoskeletal: There are acute fractures involving the right L1 and L2 transverse processes, image 68/3 and image 75/3. There is an age indeterminate compression fracture involving the T12 vertebra with loss of approximately 50% of the vertebral body height. Increased sclerosis within the vertebral body suggests a subacute or chronic fracture. IMPRESSION: 1. Acute fractures involving the right L1 and L2 transverse processes. 2. Subcutaneous soft tissue stranding along the lower ventral pelvic wall centered at the level of the pubic symphysis with small subcutaneous hematoma. Findings may reflect contusion secondary to fall. 3. Age indeterminate T12 compression fracture with loss of approximately 50% of the vertebral body height. 4. Subcutaneous soft tissue stranding is noted along the right lower neck, which is concerning for soft tissue contusion. 5. Bilateral pleural effusions, right greater than left. 6. No signs of pulmonary contusion or pneumothorax. No evidence for solid organ injury within the abdomen or pelvis. 7. Aortic Atherosclerosis (ICD10-I70.0). Electronically Signed   By: Kerby Moors M.D.   On: 09/05/2021 16:04   DG Chest Port 1 View  Result  Date: 09/05/2021 CLINICAL DATA:  Trauma in a 88 year old male. EXAM: PORTABLE CHEST 1 VIEW COMPARISON:  Comparison is made with examination from January 20, 2016. FINDINGS: Lung volumes are low. Heart size accentuated by portable technique, low lung volumes and AP projection. Mediastinal contours and hilar structures grossly stable accounting for these findings compared to previous imaging. No visible pneumothorax. Bibasilar airspace disease RIGHT greater than LEFT, somewhat indistinct on the RIGHT and partially obscuring RIGHT heart border. LEFT eighth and ninth rib fractures may be recent, not seen previously. Mildly indistinct appearance of the eighth rib fracture with more acute appearance of the ninth rib fracture, may be projectional in terms of the difference. IMPRESSION: 1. Low lung volumes with bibasilar airspace disease RIGHT greater than LEFT. Query aspiration or pneumonia on the RIGHT. 2. LEFT sided rib fractures appear mildly displaced and may be recent/acute. Electronically Signed   By: Zetta Bills M.D.   On: 09/05/2021 14:28   CT MAXILLOFACIAL WO CONTRAST  Result Date: 09/05/2021 CLINICAL DATA:  Head trauma, minor (Age >= 65y); Neck trauma (Age >= 65y); Facial trauma, blunt EXAM: CT HEAD WITHOUT CONTRAST CT MAXILLOFACIAL WITHOUT CONTRAST CT CERVICAL SPINE WITHOUT CONTRAST TECHNIQUE: Multidetector CT imaging of the head, cervical spine, and maxillofacial structures were performed using the standard protocol without intravenous contrast. Multiplanar CT image reconstructions of the cervical spine and maxillofacial structures were also generated. RADIATION DOSE REDUCTION: This exam was performed according to the departmental dose-optimization program which includes automated exposure control, adjustment of the mA and/or kV according to patient size and/or use of iterative reconstruction technique. COMPARISON:  None Available. FINDINGS: CT HEAD FINDINGS Brain: Small (2 mm) area of hyperdensity along  the anterior left falx (see series 4, 23) appears new from the prior and is concerning for acute subdural hemorrhage. No evidence of acute infarction, hydrocephalus, extra-axial collection  or mass lesion/mass effect. Chronic microvascular ischemic disease and atrophy. Vascular: Calcific intracranial atherosclerosis. Skull: Large right forehead contusion without acute calvarial fracture. Other: No mastoid effusions. CT MAXILLOFACIAL FINDINGS Osseous: Significantly motion limited evaluation without obvious fracture. Evaluation of the inferior orbits and midface is particularly limited and nondiagnostic Orbits: Motion limited without visible abnormality. Evaluation of the orbits is nondiagnostic inferiorly. Sinuses: Clear. Soft tissues: Large right periorbital/forehead contusion. CT CERVICAL SPINE FINDINGS Alignment: Rotation of C1 on C2. Similar mild degenerative anterolisthesis of C5 on C6. Otherwise, no substantial sagittal subluxation Skull base and vertebrae: Mildly displaced fracture through the right lamina C2 (see series 4, image 17; series 7, image 27). Motion limits assessment with possible nondisplaced fracture extending into the right transverse foramen at C2 (series 4, image 23). Soft tissues and spinal canal: Motion limited without obvious prevertebral fluid or swelling. No visible canal hematoma. Disc levels: Similar multilevel severe degenerative disc disease with multilevel facet and uncovertebral hypertrophy. Resulting varying degrees of neural foraminal stenosis. Upper chest: Visualized lung apices are clear. IMPRESSION: CT cervical spine: 1. Mildly displaced fracture through the right lamina C2. 2. Motion limited study with possible additional fracture extending through the transverse foramen of C2 on the right. Given this finding, recommend CTA to assess for arterial injury. 3. Mild rotation of C1 on C2, which may be positional, but MRI could provide more sensitive evaluation for ligamentous injury  if clinically warranted. CT head: 1. Small (2 mm) area of hyperdensity along the anterior left falx appears new from the prior and is concerning for acute subdural hemorrhage. 2. Large right forehead/periorbital contusion without visible acute calvarial fracture 3. Motion limited study. Maxillofacial CT: Significantly motion limited study without obvious evidence of acute fracture. Evaluation of the inferior orbits and midface is particularly limited and nondiagnostic. Given the large right periorbital/forehead contusion, consider follow-up maxillofacial CT to exclude fracture. Findings discussed with Dr. Rocky Crafts via telephone at 4:04 p.m. Electronically Signed   By: Margaretha Sheffield M.D.   On: 09/05/2021 16:06    Scheduled Meds:  lisinopril  20 mg Oral Daily   And   hydrochlorothiazide  25 mg Oral Daily   metoprolol succinate  25 mg Oral Daily   pantoprazole  40 mg Oral Daily   PARoxetine  10 mg Oral Daily   Continuous Infusions:   ceFAZolin (ANCEF) IV 1 g (09/06/21 0618)   sodium chloride       LOS: 1 day   Time spent: 40  minutes   Jesse Victor Loann Quill, MD [facility]  If 7PM-7AM, please contact night-coverage [email or web address] 09/06/2021, 10:49 AM

## 2021-09-06 NOTE — Progress Notes (Signed)
[facility] Progress Note     Subjective: CC:  Laying in bed, opens eyes to voice and mumbles incomprehensibly. No family at bedside.   Objective: Vital signs in last 24 hours: Temp:  [96.8 F (36 C)-98.8 F (37.1 C)] 97.5 F (36.4 C) (05/27 0714) Pulse Rate:  [80-92] 88 (05/27 0942) Resp:  [13-20] 18 (05/27 0942) BP: (120-152)/(65-109) 136/78 (05/27 0942) SpO2:  [93 %-100 %] 98 % (05/27 0942) Last BM Date :  (PTA)  Intake/Output from previous day: 05/26 0701 - 05/27 0700 In: 468.1 [I.V.:418.1; IV Piggyback:50] Out: -  Intake/Output this shift: No intake/output data recorded.  PE: Gen:  somnolent but easily arouses to loud voice.  Card:  afib, regular rate, chronic lower extremity edema and skin mottling  Pulm:  Normal effort on supplemental O2, 95-100% on 2L [place] Abd: Soft, non-tender, non-distended GU: external cath in place, no appreciable suprapubic or pelvic hematoma on exam Skin: warm and dry, no masses or rashes Psych: unable to assess due to patient condition -- he is opening eyes and moaning but I cannot understand him.  Neuro: opens eyes to voice, mumbling. Moving all extremities spontaneously  Lab Results:  Recent Labs    09/05/21 1422 09/05/21 1453 09/06/21 0255  WBC 11.8*  --  11.7*  HGB 11.4* 11.2* 10.5*  HCT 35.9* 33.0* 33.2*  PLT 238  --  256   BMET Recent Labs    09/05/21 1422 09/05/21 1453 09/06/21 0255  NA 139 138 137  K 3.8 3.7 4.4  CL 105 104 103  CO2 23  --  24  GLUCOSE 188* 189* 126*  BUN '21 22 20  '$ CREATININE 1.34* 1.40* 1.37*  CALCIUM 9.3  --  9.1   PT/INR Recent Labs    09/05/21 1422  LABPROT 16.6*  INR 1.4*   CMP     Component Value Date/Time   NA 137 09/06/2021 0255   K 4.4 09/06/2021 0255   CL 103 09/06/2021 0255   CO2 24 09/06/2021 0255   GLUCOSE 126 (H) 09/06/2021 0255   BUN 20 09/06/2021 0255   CREATININE 1.37 (H) 09/06/2021 0255   CALCIUM 9.1 09/06/2021 0255   PROT 6.9 09/06/2021 0255    ALBUMIN 3.2 (L) 09/06/2021 0255   AST 37 09/06/2021 0255   ALT 13 09/06/2021 0255   ALKPHOS 73 09/06/2021 0255   BILITOT 2.1 (H) 09/06/2021 0255   GFRNONAA 47 (L) 09/06/2021 0255   GFRAA 49 (L) 01/20/2016 1726   Lipase  No results found for: LIPASE     Studies/Results: CT HEAD WO CONTRAST  Result Date: 09/05/2021 CLINICAL DATA:  Head trauma, minor (Age >= 65y); Neck trauma (Age >= 65y); Facial trauma, blunt EXAM: CT HEAD WITHOUT CONTRAST CT MAXILLOFACIAL WITHOUT CONTRAST CT CERVICAL SPINE WITHOUT CONTRAST TECHNIQUE: Multidetector CT imaging of the head, cervical spine, and maxillofacial structures were performed using the standard protocol without intravenous contrast. Multiplanar CT image reconstructions of the cervical spine and maxillofacial structures were also generated. RADIATION DOSE REDUCTION: This exam was performed according to the departmental dose-optimization program which includes automated exposure control, adjustment of the mA and/or kV according to patient size and/or use of iterative reconstruction technique. COMPARISON:  None Available. FINDINGS: CT HEAD FINDINGS Brain: Small (2 mm) area of hyperdensity along the anterior left falx (see series 4, 23) appears new from the prior and is concerning for acute subdural hemorrhage. No evidence of acute infarction, hydrocephalus, extra-axial collection or mass lesion/mass effect. Chronic microvascular ischemic disease  and atrophy. Vascular: Calcific intracranial atherosclerosis. Skull: Large right forehead contusion without acute calvarial fracture. Other: No mastoid effusions. CT MAXILLOFACIAL FINDINGS Osseous: Significantly motion limited evaluation without obvious fracture. Evaluation of the inferior orbits and midface is particularly limited and nondiagnostic Orbits: Motion limited without visible abnormality. Evaluation of the orbits is nondiagnostic inferiorly. Sinuses: Clear. Soft tissues: Large right periorbital/forehead  contusion. CT CERVICAL SPINE FINDINGS Alignment: Rotation of C1 on C2. Similar mild degenerative anterolisthesis of C5 on C6. Otherwise, no substantial sagittal subluxation Skull base and vertebrae: Mildly displaced fracture through the right lamina C2 (see series 4, image 17; series 7, image 27). Motion limits assessment with possible nondisplaced fracture extending into the right transverse foramen at C2 (series 4, image 23). Soft tissues and spinal canal: Motion limited without obvious prevertebral fluid or swelling. No visible canal hematoma. Disc levels: Similar multilevel severe degenerative disc disease with multilevel facet and uncovertebral hypertrophy. Resulting varying degrees of neural foraminal stenosis. Upper chest: Visualized lung apices are clear. IMPRESSION: CT cervical spine: 1. Mildly displaced fracture through the right lamina C2. 2. Motion limited study with possible additional fracture extending through the transverse foramen of C2 on the right. Given this finding, recommend CTA to assess for arterial injury. 3. Mild rotation of C1 on C2, which may be positional, but MRI could provide more sensitive evaluation for ligamentous injury if clinically warranted. CT head: 1. Small (2 mm) area of hyperdensity along the anterior left falx appears new from the prior and is concerning for acute subdural hemorrhage. 2. Large right forehead/periorbital contusion without visible acute calvarial fracture 3. Motion limited study. Maxillofacial CT: Significantly motion limited study without obvious evidence of acute fracture. Evaluation of the inferior orbits and midface is particularly limited and nondiagnostic. Given the large right periorbital/forehead contusion, consider follow-up maxillofacial CT to exclude fracture. Findings discussed with Dr. Rocky Crafts via telephone at 4:04 p.m. Electronically Signed   By: Margaretha Sheffield M.D.   On: 09/05/2021 16:06   CT Angio Neck W and/or Wo Contrast  Result  Date: 09/05/2021 CLINICAL DATA:  Neck trauma, arterial injury suspected EXAM: CT ANGIOGRAPHY NECK TECHNIQUE: Multidetector CT imaging of the neck was performed using the standard protocol during bolus administration of intravenous contrast. Multiplanar CT image reconstructions and MIPs were obtained to evaluate the vascular anatomy. Carotid stenosis measurements (when applicable) are obtained utilizing NASCET criteria, using the distal internal carotid diameter as the denominator. RADIATION DOSE REDUCTION: This exam was performed according to the departmental dose-optimization program which includes automated exposure control, adjustment of the mA and/or kV according to patient size and/or use of iterative reconstruction technique. CONTRAST:  35m OMNIPAQUE IOHEXOL 350 MG/ML SOLN COMPARISON:  No prior CTA of the neck, correlation is made with CT head and CT cervical spine 09/05/2021 FINDINGS: Aortic arch: Standard branching. Imaged portion shows no evidence of aneurysm or dissection. No significant stenosis of the major arch vessel origins. Aortic atherosclerosis. Noncalcified plaque in the proximal left subclavian artery causes just below 50% stenosis. Right carotid system: No evidence of dissection, occlusion, or hemodynamically significant stenosis (greater than 50%). Calcifications of the bifurcation and in the proximal right ICA are not hemodynamically significant. Left carotid system: No evidence of dissection, occlusion, or hemodynamically significant stenosis (greater than 50%). Calcifications at the bifurcation and in the proximal left ICA and ECA are not hemodynamically significant. Vertebral arteries: Diminutive right vertebral artery, without evidence of arterial injury as it passes through the right C2 vertebral artery foramen. The right vertebral artery is  patent to the vertebrobasilar junction without evidence of dissection, significant stenosis, or occlusion. Moderate narrowing at the origin of the  left vertebral artery. The left vertebral artery is otherwise patent to the vertebrobasilar junction without evidence of dissection, significant stenosis, or occlusion. Skeleton: For cervical spine findings, please see same day CT cervical spine Other neck: Stranding and possible hematoma in the lower right sternocleidomastoid muscle surrounding soft tissues, likely posttraumatic. No active extravasation into the hematoma. Upper chest: Please see same-day CT chest abdomen pelvis. IMPRESSION: 1. No evidence of vascular injury. Moderate narrowing at the origin of the left vertebral artery. No other hemodynamically significant stenosis in the neck. 2. For additional head and cervical spine findings, including known C2 fracture, please see same-day CT head and cervical spine. Electronically Signed   By: Merilyn Baba M.D.   On: 09/05/2021 19:20   CT CERVICAL SPINE WO CONTRAST  Result Date: 09/05/2021 CLINICAL DATA:  Head trauma, minor (Age >= 65y); Neck trauma (Age >= 65y); Facial trauma, blunt EXAM: CT HEAD WITHOUT CONTRAST CT MAXILLOFACIAL WITHOUT CONTRAST CT CERVICAL SPINE WITHOUT CONTRAST TECHNIQUE: Multidetector CT imaging of the head, cervical spine, and maxillofacial structures were performed using the standard protocol without intravenous contrast. Multiplanar CT image reconstructions of the cervical spine and maxillofacial structures were also generated. RADIATION DOSE REDUCTION: This exam was performed according to the departmental dose-optimization program which includes automated exposure control, adjustment of the mA and/or kV according to patient size and/or use of iterative reconstruction technique. COMPARISON:  None Available. FINDINGS: CT HEAD FINDINGS Brain: Small (2 mm) area of hyperdensity along the anterior left falx (see series 4, 23) appears new from the prior and is concerning for acute subdural hemorrhage. No evidence of acute infarction, hydrocephalus, extra-axial collection or mass  lesion/mass effect. Chronic microvascular ischemic disease and atrophy. Vascular: Calcific intracranial atherosclerosis. Skull: Large right forehead contusion without acute calvarial fracture. Other: No mastoid effusions. CT MAXILLOFACIAL FINDINGS Osseous: Significantly motion limited evaluation without obvious fracture. Evaluation of the inferior orbits and midface is particularly limited and nondiagnostic Orbits: Motion limited without visible abnormality. Evaluation of the orbits is nondiagnostic inferiorly. Sinuses: Clear. Soft tissues: Large right periorbital/forehead contusion. CT CERVICAL SPINE FINDINGS Alignment: Rotation of C1 on C2. Similar mild degenerative anterolisthesis of C5 on C6. Otherwise, no substantial sagittal subluxation Skull base and vertebrae: Mildly displaced fracture through the right lamina C2 (see series 4, image 17; series 7, image 27). Motion limits assessment with possible nondisplaced fracture extending into the right transverse foramen at C2 (series 4, image 23). Soft tissues and spinal canal: Motion limited without obvious prevertebral fluid or swelling. No visible canal hematoma. Disc levels: Similar multilevel severe degenerative disc disease with multilevel facet and uncovertebral hypertrophy. Resulting varying degrees of neural foraminal stenosis. Upper chest: Visualized lung apices are clear. IMPRESSION: CT cervical spine: 1. Mildly displaced fracture through the right lamina C2. 2. Motion limited study with possible additional fracture extending through the transverse foramen of C2 on the right. Given this finding, recommend CTA to assess for arterial injury. 3. Mild rotation of C1 on C2, which may be positional, but MRI could provide more sensitive evaluation for ligamentous injury if clinically warranted. CT head: 1. Small (2 mm) area of hyperdensity along the anterior left falx appears new from the prior and is concerning for acute subdural hemorrhage. 2. Large right  forehead/periorbital contusion without visible acute calvarial fracture 3. Motion limited study. Maxillofacial CT: Significantly motion limited study without obvious evidence of acute fracture. Evaluation  of the inferior orbits and midface is particularly limited and nondiagnostic. Given the large right periorbital/forehead contusion, consider follow-up maxillofacial CT to exclude fracture. Findings discussed with Dr. Rocky Crafts via telephone at 4:04 p.m. Electronically Signed   By: Margaretha Sheffield M.D.   On: 09/05/2021 16:06   DG Pelvis Portable  Result Date: 09/05/2021 CLINICAL DATA:  Trauma. EXAM: PORTABLE PELVIS 1-2 VIEWS COMPARISON:  None Available. FINDINGS: There is no evidence of pelvic fracture or diastasis. No pelvic bone lesions are seen. IMPRESSION: Negative. Electronically Signed   By: Marijo Conception M.D.   On: 09/05/2021 14:26   CT CHEST ABDOMEN PELVIS W CONTRAST  Result Date: 09/05/2021 CLINICAL DATA:  Unwitnessed fall. EXAM: CT CHEST, ABDOMEN, AND PELVIS WITH CONTRAST TECHNIQUE: Multidetector CT imaging of the chest, abdomen and pelvis was performed following the standard protocol during bolus administration of intravenous contrast. RADIATION DOSE REDUCTION: This exam was performed according to the departmental dose-optimization program which includes automated exposure control, adjustment of the mA and/or kV according to patient size and/or use of iterative reconstruction technique. CONTRAST:  80m OMNIPAQUE IOHEXOL 300 MG/ML  SOLN COMPARISON:  None Available. FINDINGS: CT CHEST FINDINGS Cardiovascular: Cardiac enlargement. No pericardial effusion. Aortic atherosclerosis and coronary artery calcifications. Mediastinum/Nodes: Soft tissue stranding along the right lower neck is identified which may reflect sequelae of contusion, image 9/3. Thyroid gland, trachea, and esophagus are unremarkable. No enlarged axillary, supraclavicular, mediastinal, or hilar lymph nodes. Lungs/Pleura: Small to  moderate right pleural effusion is identified. This is low in attenuation measuring 12 Hounsfield units, image 45/3. Trace left pleural effusion is also noted. Atelectasis and ground-glass attenuation overlying the right pleural effusion is identified. No pneumothorax or signs of contusion. No suspicious lung nodules. Musculoskeletal: No acute osseous findings. Remote left posterior rib fracture deformities appear healed. No acute rib fractures identified bilaterally. CT ABDOMEN PELVIS FINDINGS Hepatobiliary: No hepatic injury or perihepatic hematoma. Status post cholecystectomy. Pancreas: Unremarkable. No pancreatic ductal dilatation or surrounding inflammatory changes. Spleen: No splenic injury or perisplenic hematoma. Adrenals/Urinary Tract: No adrenal hemorrhage or renal injury identified. Bilateral Bosniak class 1 kidney cysts. No follow-up recommended. Bladder is unremarkable. Stomach/Bowel: Stomach is within normal limits. Sigmoid diverticulosis without signs of acute diverticulitis. No evidence of bowel wall thickening, distention, or inflammatory changes. Vascular/Lymphatic: Aortic atherosclerosis without aneurysm. No signs of abdominopelvic adenopathy. Reproductive: Prostate gland enlargement. Other: No free fluid or fluid collections within the abdomen or pelvis. There is subcutaneous soft tissue stranding along the lower ventral pelvic wall centered at the level of the pubic symphysis. Small subcutaneous hematoma is noted measuring 1.3 x 2.2 cm. Soft tissue stranding extends towards the base of the penis. Findings may reflect contusion after fall. Musculoskeletal: There are acute fractures involving the right L1 and L2 transverse processes, image 68/3 and image 75/3. There is an age indeterminate compression fracture involving the T12 vertebra with loss of approximately 50% of the vertebral body height. Increased sclerosis within the vertebral body suggests a subacute or chronic fracture. IMPRESSION: 1.  Acute fractures involving the right L1 and L2 transverse processes. 2. Subcutaneous soft tissue stranding along the lower ventral pelvic wall centered at the level of the pubic symphysis with small subcutaneous hematoma. Findings may reflect contusion secondary to fall. 3. Age indeterminate T12 compression fracture with loss of approximately 50% of the vertebral body height. 4. Subcutaneous soft tissue stranding is noted along the right lower neck, which is concerning for soft tissue contusion. 5. Bilateral pleural effusions, right greater than left.  6. No signs of pulmonary contusion or pneumothorax. No evidence for solid organ injury within the abdomen or pelvis. 7. Aortic Atherosclerosis (ICD10-I70.0). Electronically Signed   By: Kerby Moors M.D.   On: 09/05/2021 16:04   DG Chest Port 1 View  Result Date: 09/05/2021 CLINICAL DATA:  Trauma in a 88 year old male. EXAM: PORTABLE CHEST 1 VIEW COMPARISON:  Comparison is made with examination from January 20, 2016. FINDINGS: Lung volumes are low. Heart size accentuated by portable technique, low lung volumes and AP projection. Mediastinal contours and hilar structures grossly stable accounting for these findings compared to previous imaging. No visible pneumothorax. Bibasilar airspace disease RIGHT greater than LEFT, somewhat indistinct on the RIGHT and partially obscuring RIGHT heart border. LEFT eighth and ninth rib fractures may be recent, not seen previously. Mildly indistinct appearance of the eighth rib fracture with more acute appearance of the ninth rib fracture, may be projectional in terms of the difference. IMPRESSION: 1. Low lung volumes with bibasilar airspace disease RIGHT greater than LEFT. Query aspiration or pneumonia on the RIGHT. 2. LEFT sided rib fractures appear mildly displaced and may be recent/acute. Electronically Signed   By: Zetta Bills M.D.   On: 09/05/2021 14:28   CT MAXILLOFACIAL WO CONTRAST  Result Date: 09/05/2021 CLINICAL  DATA:  Head trauma, minor (Age >= 65y); Neck trauma (Age >= 65y); Facial trauma, blunt EXAM: CT HEAD WITHOUT CONTRAST CT MAXILLOFACIAL WITHOUT CONTRAST CT CERVICAL SPINE WITHOUT CONTRAST TECHNIQUE: Multidetector CT imaging of the head, cervical spine, and maxillofacial structures were performed using the standard protocol without intravenous contrast. Multiplanar CT image reconstructions of the cervical spine and maxillofacial structures were also generated. RADIATION DOSE REDUCTION: This exam was performed according to the departmental dose-optimization program which includes automated exposure control, adjustment of the mA and/or kV according to patient size and/or use of iterative reconstruction technique. COMPARISON:  None Available. FINDINGS: CT HEAD FINDINGS Brain: Small (2 mm) area of hyperdensity along the anterior left falx (see series 4, 23) appears new from the prior and is concerning for acute subdural hemorrhage. No evidence of acute infarction, hydrocephalus, extra-axial collection or mass lesion/mass effect. Chronic microvascular ischemic disease and atrophy. Vascular: Calcific intracranial atherosclerosis. Skull: Large right forehead contusion without acute calvarial fracture. Other: No mastoid effusions. CT MAXILLOFACIAL FINDINGS Osseous: Significantly motion limited evaluation without obvious fracture. Evaluation of the inferior orbits and midface is particularly limited and nondiagnostic Orbits: Motion limited without visible abnormality. Evaluation of the orbits is nondiagnostic inferiorly. Sinuses: Clear. Soft tissues: Large right periorbital/forehead contusion. CT CERVICAL SPINE FINDINGS Alignment: Rotation of C1 on C2. Similar mild degenerative anterolisthesis of C5 on C6. Otherwise, no substantial sagittal subluxation Skull base and vertebrae: Mildly displaced fracture through the right lamina C2 (see series 4, image 17; series 7, image 27). Motion limits assessment with possible nondisplaced  fracture extending into the right transverse foramen at C2 (series 4, image 23). Soft tissues and spinal canal: Motion limited without obvious prevertebral fluid or swelling. No visible canal hematoma. Disc levels: Similar multilevel severe degenerative disc disease with multilevel facet and uncovertebral hypertrophy. Resulting varying degrees of neural foraminal stenosis. Upper chest: Visualized lung apices are clear. IMPRESSION: CT cervical spine: 1. Mildly displaced fracture through the right lamina C2. 2. Motion limited study with possible additional fracture extending through the transverse foramen of C2 on the right. Given this finding, recommend CTA to assess for arterial injury. 3. Mild rotation of C1 on C2, which may be positional, but MRI could provide more  sensitive evaluation for ligamentous injury if clinically warranted. CT head: 1. Small (2 mm) area of hyperdensity along the anterior left falx appears new from the prior and is concerning for acute subdural hemorrhage. 2. Large right forehead/periorbital contusion without visible acute calvarial fracture 3. Motion limited study. Maxillofacial CT: Significantly motion limited study without obvious evidence of acute fracture. Evaluation of the inferior orbits and midface is particularly limited and nondiagnostic. Given the large right periorbital/forehead contusion, consider follow-up maxillofacial CT to exclude fracture. Findings discussed with Dr. Rocky Crafts via telephone at 4:04 p.m. Electronically Signed   By: Margaretha Sheffield M.D.   On: 09/05/2021 16:06    Anti-infectives: Anti-infectives (From admission, onward)    Start     Dose/Rate Route Frequency Ordered Stop   09/05/21 1900  ceFAZolin (ANCEF) IVPB 1 g/50 mL premix        1 g 100 mL/hr over 30 Minutes Intravenous Every 8 hours 09/05/21 1854     09/05/21 1645  ceFAZolin (ANCEF) IVPB 1 g/50 mL premix  Status:  Discontinued        1 g 100 mL/hr over 30 Minutes Intravenous  Once 09/05/21  1641 09/05/21 1854        Assessment/Plan  88 year old male status post ground-level fall. Right L1-L2 TP fracture - per NS Age-indeterminate T12 compression fracture - per NS Fracture through C2 right lamina and possible transverse foramen - c-collar per NS, they signed off Subdural hematoma- per NS Right forehead contusion/perirobital edema and hematoma - apple ice, per Dr. Johney Frame note his pupils/conjunctivae were normal on admission. I am unable to evaluate his R eye fully due to swelling. Monitor.  No new acute traumatic injuries identified today on tertiary survey. H&H stable (10.5 from 11.2).  No further acute traumatic surgery needs. Advance diet per primary team. [facility] for chemical VTE 48 h after injury per NS. We will sign off. Call as needed.    History of A-fib Hypertension Chronic kidney disease     LOS: 1 day   I reviewed Consultant neurosurgery notes, hospitalist notes, last 24 h vitals and pain scores, last 48 h intake and output, last 24 h labs and trends, and last 24 h imaging results.    Obie Dredge, PA-C [facility] Please see Amion for pager number during day hours 7:00am-4:30pm

## 2021-09-06 NOTE — Consult Note (Signed)
Consultation Note Date: 09/06/2021   Patient Name: Jesse Benjamin  DOB: 1921-04-13  MRN: [id number]  Age / Sex: 88 y.o., male  PCP: London Pepper, MD Referring Physician: Mckinley Jewel, MD  Reason for Consultation: Establishing goals of care  HPI/Patient Profile: 88 y.o. male  with past medical history of advanced dementia, frequent falls, hypertension, atrial fibrillation, CKD stage 3, GERD admitted on 09/05/2021 with fall resulting in fractures of C2 right lamina and possible transverse foramen, right L1-L2 transverse process, left ribs as well as SDH. Also treating for BLE cellulitis.   Clinical Assessment and Goals of Care: I met today at Jesse Benjamin bedside along with his daughter and son-in-law Katharine Look and Al after receiving report from RN. Jesse Benjamin is lying in bed and is very hard of hearing but with limited response from hearing as well as dementia. He rested well throughout the majority of my visit but became more restless and frequently lifting his hand to his face - mainly his right eye that is severely swollen and bruised. RN to give some pain medication.   Long discussion with family at bedside about his condition and expectations moving forward. We discussed overall poor prognosis given severity of fall and injury in setting of dementia and advanced age. They have noted some decline over months to years. Less mobile since Christmas and more issues with falls. Appetite has remained good per family. They did note more issues with possible hallucinations at home and they question if this was what prompted his fall as he was in an unusual place in the house (somewhere he does not usually go) and no signs of his walker around. Difficult to know for sure.   We further discussed path forward and the option of hospice facility care and what this looks like and means. We discussed the importance of QOL. I  explained that some patients/families at times make the difficult but loving decision not to treat with antibiotics or IVF so as not to prolong suffering or prolong life if the quality is not going to be acceptable. I urged them to continue to consider helping [place] to know how we can best help and care for Jesse Benjamin. They are not prepared to make any decisions today but are open to further discussion tomorrow.   Of note - Jesse Benjamin is from [place], [place]. He had 3 children and he lived with his son after his wife died but his son is now deceased (other daughter is noted to not be involved in his care or life). He moved to [place] to live with Katharine Look ~6 years ago. He is a very faithful man who has always been active in his church.   All questions/concerns addressed. Emotional support provided.   Primary Decision Maker NEXT OF KIN daughter Katharine Look    SUMMARY OF RECOMMENDATIONS   - DNR in place - Considering hospice placement  Code Status/Advance Care Planning: DNR   Symptom Management:  Per attending.   Palliative Prophylaxis:  Aspiration, Bowel Regimen, Delirium Protocol,  Frequent Pain Assessment, Oral Care, and Turn Reposition  Psycho-social/Spiritual:  Desire for further Chaplaincy support:yes Additional Recommendations: Education on Hospice and Grief/Bereavement Support  Prognosis:  Overall prognosis poor.   Discharge Planning: To Be Determined      Primary Diagnoses: Present on Admission:  Fall   I have reviewed the medical record, interviewed the patient and family, and examined the patient. The following aspects are pertinent.  Past Medical History:  Diagnosis Date   Atrial fibrillation ([place])    Bronchitis, acute    Cancer ([place])    CKD (chronic kidney disease)    Dementia ([facility])    Frequent falls    Hypertension    Palpitations    Social History   Socioeconomic History   Marital status: Widowed    Spouse name: Not on file   Number of children: 3   Years  of education: college   Highest education level: Not on file  Occupational History   Occupation: retired  Tobacco Use   Smoking status: Never   Smokeless tobacco: Never  Substance and Sexual Activity   Alcohol use: No   Drug use: No   Sexual activity: Never  Other Topics Concern   Not on file  Social History Narrative   Not on file   Social Determinants of Health   Financial Resource Strain: Not on file  Food Insecurity: Not on file  Transportation Needs: Not on file  Physical Activity: Not on file  Stress: Not on file  Social Connections: Not on file   History reviewed. No pertinent family history. Scheduled Meds:  lisinopril  20 mg Oral Daily   And   hydrochlorothiazide  25 mg Oral Daily   metoprolol succinate  25 mg Oral Daily   pantoprazole  40 mg Oral Daily   PARoxetine  10 mg Oral Daily   Continuous Infusions:   ceFAZolin (ANCEF) IV 1 g (09/06/21 0618)   sodium chloride     PRN Meds:.acetaminophen **OR** acetaminophen, haloperidol lactate, HYDROmorphone (DILAUDID) injection, ondansetron **OR** ondansetron (ZOFRAN) IV, perflutren lipid microspheres (DEFINITY) IV suspension No Known Allergies Review of Systems  Unable to perform ROS: Dementia   Physical Exam Vitals and nursing note reviewed.  Constitutional:      General: He is not in acute distress.    Appearance: He is ill-appearing.     Comments: Severe bruising and edema to right periorbital area  Cardiovascular:     Rate and Rhythm: Normal rate.  Pulmonary:     Effort: No tachypnea, accessory muscle usage or respiratory distress.  Abdominal:     Palpations: Abdomen is soft.  Neurological:     Mental Status: He is lethargic and confused.    Vital Signs: BP 122/68 (BP Location: Left Arm)   Pulse 78   Temp 97.6 F (36.4 C) (Axillary)   Resp 18   SpO2 98%  Pain Scale: PAINAD   Pain Score: 2    SpO2: SpO2: 98 % O2 Device:SpO2: 98 % O2 Flow Rate: .O2 Flow Rate (L/min): 2 L/min  IO:  Intake/output summary:  Intake/Output Summary (Last 24 hours) at 09/06/2021 1136 Last data filed at 09/06/2021 1121 Gross per 24 hour  Intake 468.09 ml  Output 300 ml  Net 168.09 ml    LBM: Last BM Date :  (PTA) Baseline Weight:   Most recent weight:       Palliative Assessment/Data:     Time In: 1610  Time Total: 75 min  Greater than 50%  of this  time was spent counseling and coordinating care related to the above assessment and plan.  Signed by: Vinie Sill, NP Palliative Medicine Team Pager # [phone] (M-F 8a-5p) Team Phone # [phone] (Nights/Weekends)

## 2021-09-06 NOTE — Evaluation (Signed)
Clinical/Bedside Swallow Evaluation Patient Details  Name: Jesse Benjamin MRN: [id number] Date of Birth: Aug 26, 1920  Today's Date: 09/06/2021 Time: SLP Start Time (ACUTE ONLY): 56 SLP Stop Time (ACUTE ONLY): 1115 SLP Time Calculation (min) (ACUTE ONLY): 13 min  Past Medical History:  Past Medical History:  Diagnosis Date   Atrial fibrillation ([place])    Bronchitis, acute    Cancer ([place])    CKD (chronic kidney disease)    Dementia (H[facility])    Frequent falls    Hypertension    Palpitations    Past Surgical History:  Past Surgical History:  Procedure Laterality Date   CAROTID BODY TUMOR EXCISION     KNEE SURGERY Right    RECURRENT HERNIA     x 2   HPI:  Pt is a 88 y.o. male brought to the ED after he was found down at home after his daughter and her husband returned home after having lunch.  It is not entirely certain what happened, but he was near the stairs and they assumed that it must have been related to him trying to climb the stairs. CT head (5/26) concerning for acute subdural hemorrhage. CXR (5/26) revealed "Low lung volumes with bibasilar airspace disease RIGHT greater than LEFT. Query aspiration or pneumonia on the RIGHT". RN concerned with pt coughing with sips of water and BSE ordered. PMH: hypertension, atrial fibrillation, CKD stage III, advanced dementia, frequent falls, and GERD.    Assessment / Plan / Recommendation  Clinical Impression  Pt very lethargic with brief moments of alertness, impacting completion of full swallow evaluation. After repositioning pt upright in bed and providing max verbal/tactile stimulation cues, pt achieved adequate alertness for POs. He was unable to follow simple commands for completion of oral mechanism examination, although no obvious facial asymmetry noted. He did not open oral cavity on command or with tactile cueing in order for clinician to inspect oral cavity/dentition, although daughter reports some missing dentition. Pt was asleep within  moments and despite ice chips and straw to lips, pt did not orally accept any POs. Max verbal/ tactile stimuation cues and repositioning also ineffective to improve alertness and PO trials terminated. Daughter, present for evaluation, reports that at [facility] pt consumed a regular diet with minimal- no difficulty with swallowing. She stated at the end of meals he often would have instances of reflux and would cough a little with that, but otherwise was without coughing/choking with POs. He took meds whole with water. Given limited evaluation this date due to pt's poor level of alertness, recommend continue NPO with SLP to f/u for PO trials to assess safest and least restrictive diet.  SLP Visit Diagnosis: Dysphagia, unspecified (R13.10)    Aspiration Risk       Diet Recommendation NPO   Medication Administration: Via alternative means    Other  Recommendations Oral Care Recommendations: Oral care QID;Staff/trained caregiver to provide oral care    Recommendations for follow up therapy are one component of a multi-disciplinary discharge planning process, led by the attending physician.  Recommendations may be updated based on patient status, additional functional criteria and insurance authorization.  Follow up Recommendations Other (comment) (TBD)      Assistance Recommended at Discharge Frequent or constant Supervision/Assistance  Functional Status Assessment Patient has had a recent decline in their functional status and demonstrates the ability to make significant improvements in function in a reasonable and predictable amount of time.  Frequency and Duration min 2x/week  2 weeks  Prognosis Prognosis for Safe Diet Advancement: Good Barriers to Reach Goals: Cognitive deficits;Time post onset      Swallow Study   General Date of Onset: 09/06/21 HPI: Pt is a 88 y.o. male brought to the ED after he was found down at home after his daughter and her husband returned home after having  lunch.  It is not entirely certain what happened, but he was near the stairs and they assumed that it must have been related to him trying to climb the stairs. CT head (5/26) concerning for acute subdural hemorrhage. CXR (5/26) revealed "Low lung volumes with bibasilar airspace disease RIGHT greater than LEFT. Query aspiration or pneumonia on the RIGHT". RN concerned with pt coughing with sips of water and BSE ordered. PMH: hypertension, atrial fibrillation, CKD stage III, advanced dementia, frequent falls, and GERD. Type of Study: Bedside Swallow Evaluation Previous Swallow Assessment: none per EMR Diet Prior to this Study: NPO Temperature Spikes Noted: No Respiratory Status: Room air History of Recent Intubation: No Behavior/Cognition: Lethargic/Drowsy;Pleasant mood;Confused Oral Cavity Assessment: Other (comment) (unable to assess) Oral Care Completed by SLP: No Oral Cavity - Dentition: Other (Comment) (missing dentition per daughter, does not wear dentures/partials) Patient Positioning: Upright in bed;Postural control adequate for testing Baseline Vocal Quality: Normal    Oral/Motor/Sensory Function Overall Oral Motor/Sensory Function: Other (comment) (difficult to assess; no obvious facial asymmetry)   Ice Chips Ice chips: Impaired Presentation: Spoon (clinician's gloved hand) Oral Phase Impairments: Other (comment);Poor awareness of bolus (reduced oral acceptance)   Thin Liquid Thin Liquid: Not tested    Nectar Thick Nectar Thick Liquid: Not tested   Honey Thick Honey Thick Liquid: Not tested   Puree Puree: Not tested   Solid     Solid: Not tested       [address], [place], [place] Office Number: [phone]  Acie Fredrickson 09/06/2021,11:33 AM

## 2021-09-07 DIAGNOSIS — L03115 Cellulitis of right lower limb: Secondary | ICD-10-CM | POA: Diagnosis not present

## 2021-09-07 DIAGNOSIS — L03116 Cellulitis of left lower limb: Secondary | ICD-10-CM | POA: Diagnosis not present

## 2021-09-07 DIAGNOSIS — F03918 Unspecified dementia, unspecified severity, with other behavioral disturbance: Secondary | ICD-10-CM | POA: Diagnosis not present

## 2021-09-07 DIAGNOSIS — I482 Chronic atrial fibrillation, unspecified: Secondary | ICD-10-CM | POA: Diagnosis not present

## 2021-09-07 DIAGNOSIS — Z7189 Other specified counseling: Secondary | ICD-10-CM

## 2021-09-07 DIAGNOSIS — S12101A Unspecified nondisplaced fracture of second cervical vertebra, initial encounter for closed fracture: Secondary | ICD-10-CM | POA: Diagnosis not present

## 2021-09-07 DIAGNOSIS — Z515 Encounter for palliative care: Secondary | ICD-10-CM

## 2021-09-07 DIAGNOSIS — W19XXXD Unspecified fall, subsequent encounter: Secondary | ICD-10-CM

## 2021-09-07 DIAGNOSIS — N1831 Chronic kidney disease, stage 3a: Secondary | ICD-10-CM | POA: Diagnosis not present

## 2021-09-07 LAB — BASIC METABOLIC PANEL
Anion gap: 8 (ref 5–15)
BUN: 24 mg/dL — ABNORMAL HIGH (ref 8–23)
CO2: 25 mmol/L (ref 22–32)
Calcium: 8.8 mg/dL — ABNORMAL LOW (ref 8.9–10.3)
Chloride: 107 mmol/L (ref 98–111)
Creatinine, Ser: 1.33 mg/dL — ABNORMAL HIGH (ref 0.61–1.24)
GFR, Estimated: 48 mL/min — ABNORMAL LOW (ref >60)
Glucose, Bld: 116 mg/dL — ABNORMAL HIGH (ref 70–99)
Potassium: 3.9 mmol/L (ref 3.5–5.1)
Sodium: 140 mmol/L (ref 135–145)

## 2021-09-07 LAB — CBC
HCT: 33.7 % — ABNORMAL LOW (ref 39.0–52.0)
Hemoglobin: 10.4 g/dL — ABNORMAL LOW (ref 13.0–17.0)
MCH: 31.8 pg (ref 26.0–34.0)
MCHC: 30.9 g/dL (ref 30.0–36.0)
MCV: 103.1 fL — ABNORMAL HIGH (ref 80.0–100.0)
Platelets: 250 10*3/uL (ref 150–400)
RBC: 3.27 MIL/uL — ABNORMAL LOW (ref 4.22–5.81)
RDW: 16.8 % — ABNORMAL HIGH (ref 11.5–15.5)
WBC: 11.8 10*3/uL — ABNORMAL HIGH (ref 4.0–10.5)
nRBC: 0 % (ref 0.0–0.2)

## 2021-09-07 MED ORDER — SODIUM CHLORIDE 0.9 % IV SOLN: INTRAVENOUS | Status: DC

## 2021-09-07 MED ORDER — FOOD THICKENER (SIMPLYTHICK HONEY): 10.0000 | ORAL | Status: DC | PRN

## 2021-09-07 NOTE — Progress Notes (Signed)
PROGRESS NOTE    Jesse Benjamin  IRS:[id number] DOB: [birth year] DOA: 09/05/2021 PCP: London Pepper, MD   Brief Narrative:  Jesse Benjamin is a 88 y.o. male with medical history significant for hypertension, atrial fibrillation, CKD stage III, advanced dementia, frequent falls, and GERD.  He was brought to the ED after he was found down at home after his daughter and her husband returned home after having lunch.   ED Course: Vital signs stable with some mild blood pressure elevations noted.  Mild leukocytosis of 11,800 and hemoglobin 11.2.  Creatinine appears to be at baseline at 1.4 and lactic acid is 1.2.  Multiple imaging studies performed with noted C2 lamina fracture as well as L1 and L2 transverse process fractures as well as left-sided rib fractures.  Acute subdural hematoma also present on CT scan.  Trauma surgery has been contacted for further evaluation and patient is currently in c-collar.  Neurosurgery has also been contacted for further evaluation.  He started on Ancef for bilateral lower extremity cellulitis.  Assessment & Plan:  Fall-initial encounter -Noted fractures to cervical spine, lumbar spine as well as rib fractures along with subdural hemorrhage -Appreciate trauma service evaluation with no anticipated surgical intervention at this point-signed off -Hard c-collar in place -We will keep him n.p.o. for now.   -Consult PT/OT/SLP    Acute subdural hematoma: Right periorbital edema and hematoma: -Appreciate neurosurgery evaluation.  CTA negative -Neurosurgery recommended SDH is nonoperative, no follow-up study needed.  Okay for DVT chemoprophylaxis at 48 hours posttrauma -Neurosurgery signed off   Possible syncopal episode -Monitor on telemetry -2 D echocardiogram shows ejection fraction of 60 to 65% with indeterminate left ventricular diastolic parameters.  RV systolic function is severely reduced with size is severely enlarged.  Mild MR, severe TR aortic valve  sclerosis.  bilateral lower extremity cellulitis -Continue on Ancef -He is afebrile with leukocytosis of 11.8 -Blood culture no growth till date  Poor p.o. intake: -Evaluated by SLP recommended n.p.o. at this time. -Patient appears slightly dehydrated this morning.  Start on gentle hydration.  History of dementia without behavioral disturbances: -Haldol as needed -Delirium precautions   Macrocytic anemia: -H&H is stable.  Normal B12, folate, iron panel   History of atrial fibrillation -On metoprolol at home.  Currently in sinus rhythm. -Hold on metoprolol at this time   Hypertension -Patient is currently stable.  Hold p.o. antihypertensive at this time as patient is NPO.   CKD stage IIIa -Currently near baseline creatinine levels when compared to lab work 5 years ago -Start on gentle hydration due to poor p.o. intake.  Monitor renal function closely   GERD -Hold PPI   Anxiety/depression -Hold home Paxil  Goals of care: Overall poor prognosis.  Failed swallow evaluation.  He is currently NPO. Discussed with patient's daughter on the phone on 5/27-family is more leaning forward to hospice.  Palliative care on board-appreciate help.  DVT prophylaxis: SCD Code Status: DNR Family Communication:  None present at bedside.  Plan of care discussed with patient in length and he verbalized understanding and agreed with it. I called patient's daughter and discussed plan of care and she verbalized understanding Disposition Plan: To be determined  Consultants:  Trauma surgery Neurosurgery Palliative care  Procedures:  None  Antimicrobials:  Ancef  Status is: Inpatient    Subjective: Patient seen and examined.  He opened his eyes with verbal command.  Has mittens in his hand.  Appears confused.  Has right periorbital bruise.  Unable to open  his right eye.  No acute events overnight.  Objective: Vitals:   09/06/21 2300 09/07/21 0356 09/07/21 0728 09/07/21 1053  BP:  131/63 111/80 (!) 132/59 120/67  Pulse: 93 94 87 91  Resp: '18 12 16 18  '$ Temp: (!) 97.4 F (36.3 C) (!) 97.4 F (36.3 C) (!) 97.4 F (36.3 C) (!) 97.3 F (36.3 C)  TempSrc: Axillary Axillary Axillary Axillary  SpO2: 98% 93% 93% 91%    Intake/Output Summary (Last 24 hours) at 09/07/2021 1103 Last data filed at 09/07/2021 0600 Gross per 24 hour  Intake 200.03 ml  Output 950 ml  Net -749.97 ml    There were no vitals filed for this visit.  Examination:  General exam: Appears calm, has nasal cannula Respiratory system: Clear to auscultation. Respiratory effort normal. Cardiovascular system: S1 & S2 heard, RRR. No JVD, murmurs, rubs, gallops or clicks. No pedal edema. Gastrointestinal system: Abdomen is nondistended, soft and nontender. No organomegaly or masses felt. Normal bowel sounds heard. Central nervous system: Sleepy but arousable with verbal command.  Extremities: Move all 4 extremities appropriately Skin: Has large right periorbital hematoma Dried blood noted on right ear.   Bilateral lower extremities: Erythematous, dry thick skin plaques noted in bilateral lower extremities. Psychiatry: Unable to assess at this time   Data Reviewed: I have personally reviewed following labs and imaging studies  CBC: Recent Labs  Lab 09/05/21 1422 09/05/21 1453 09/06/21 0255 09/07/21 0214  WBC 11.8*  --  11.7* 11.8*  HGB 11.4* 11.2* 10.5* 10.4*  HCT 35.9* 33.0* 33.2* 33.7*  MCV 104.7*  --  100.9* 103.1*  PLT 238  --  256 962    Basic Metabolic Panel: Recent Labs  Lab 09/05/21 1422 09/05/21 1453 09/06/21 0255 09/07/21 0214  NA 139 138 137 140  K 3.8 3.7 4.4 3.9  CL 105 104 103 107  CO2 23  --  24 25  GLUCOSE 188* 189* 126* 116*  BUN '21 22 20 '$ 24*  CREATININE 1.34* 1.40* 1.37* 1.33*  CALCIUM 9.3  --  9.1 8.8*  MG  --   --  1.8  --     GFR: CrCl cannot be calculated (Unknown ideal weight.). Liver Function Tests: Recent Labs  Lab 09/05/21 1422 09/06/21 0255   AST 28 37  ALT 13 13  ALKPHOS 75 73  BILITOT 1.9* 2.1*  PROT 7.1 6.9  ALBUMIN 3.4* 3.2*    No results for input(s): LIPASE, AMYLASE in the last 168 hours. No results for input(s): AMMONIA in the last 168 hours. Coagulation Profile: Recent Labs  Lab 09/05/21 1422  INR 1.4*    Cardiac Enzymes: Recent Labs  Lab 09/05/21 1422  CKTOTAL 160    BNP (last 3 results) No results for input(s): PROBNP in the last 8760 hours. HbA1C: No results for input(s): HGBA1C in the last 72 hours. CBG: No results for input(s): GLUCAP in the last 168 hours. Lipid Profile: No results for input(s): CHOL, HDL, LDLCALC, TRIG, CHOLHDL, LDLDIRECT in the last 72 hours. Thyroid Function Tests: No results for input(s): TSH, T4TOTAL, FREET4, T3FREE, THYROIDAB in the last 72 hours. Anemia Panel: Recent Labs    09/05/21 2021  VITAMINB12 668  FOLATE >40.0  FERRITIN 38  TIBC 374  IRON 58  RETICCTPCT 4.2*    Sepsis Labs: Recent Labs  Lab 09/05/21 1422  LATICACIDVEN 2.1*     Recent Results (from the past 240 hour(s))  Culture, blood (routine x 2)     Status: None (Preliminary  result)   Collection Time: 09/05/21  5:49 PM   Specimen: BLOOD  Result Value Ref Range Status   Specimen Description BLOOD BLOOD LEFT HAND  Final   Special Requests   Final    BOTTLES DRAWN AEROBIC AND ANAEROBIC Blood Culture adequate volume   Culture   Final    NO GROWTH < 24 HOURS Performed at [facility], [address] [place], [place] [address]    Report Status PENDING  Incomplete  Culture, blood (routine x 2)     Status: None (Preliminary result)   Collection Time: 09/05/21  8:21 PM   Specimen: BLOOD  Result Value Ref Range Status   Specimen Description BLOOD LEFT ANTECUBITAL  Final   Special Requests   Final    BOTTLES DRAWN AEROBIC AND ANAEROBIC Blood Culture results may not be optimal due to an inadequate volume of blood received in culture bottles   Culture   Final    NO GROWTH < 12  HOURS Performed at [facility], [place] [address] [place], [place] [address]    Report Status PENDING  Incomplete       Radiology Studies: CT HEAD WO CONTRAST  Result Date: 09/05/2021 CLINICAL DATA:  Head trauma, minor (Age >= 65y); Neck trauma (Age >= 65y); Facial trauma, blunt EXAM: CT HEAD WITHOUT CONTRAST CT MAXILLOFACIAL WITHOUT CONTRAST CT CERVICAL SPINE WITHOUT CONTRAST TECHNIQUE: Multidetector CT imaging of the head, cervical spine, and maxillofacial structures were performed using the standard protocol without intravenous contrast. Multiplanar CT image reconstructions of the cervical spine and maxillofacial structures were also generated. RADIATION DOSE REDUCTION: This exam was performed according to the departmental dose-optimization program which includes automated exposure control, adjustment of the mA and/or kV according to patient size and/or use of iterative reconstruction technique. COMPARISON:  None Available. FINDINGS: CT HEAD FINDINGS Brain: Small (2 mm) area of hyperdensity along the anterior left falx (see series 4, 23) appears new from the prior and is concerning for acute subdural hemorrhage. No evidence of acute infarction, hydrocephalus, extra-axial collection or mass lesion/mass effect. Chronic microvascular ischemic disease and atrophy. Vascular: Calcific intracranial atherosclerosis. Skull: Large right forehead contusion without acute calvarial fracture. Other: No mastoid effusions. CT MAXILLOFACIAL FINDINGS Osseous: Significantly motion limited evaluation without obvious fracture. Evaluation of the inferior orbits and midface is particularly limited and nondiagnostic Orbits: Motion limited without visible abnormality. Evaluation of the orbits is nondiagnostic inferiorly. Sinuses: Clear. Soft tissues: Large right periorbital/forehead contusion. CT CERVICAL SPINE FINDINGS Alignment: Rotation of C1 on C2. Similar mild degenerative anterolisthesis of C5 on C6. Otherwise, no  substantial sagittal subluxation Skull base and vertebrae: Mildly displaced fracture through the right lamina C2 (see series 4, image 17; series 7, image 27). Motion limits assessment with possible nondisplaced fracture extending into the right transverse foramen at C2 (series 4, image 23). Soft tissues and spinal canal: Motion limited without obvious prevertebral fluid or swelling. No visible canal hematoma. Disc levels: Similar multilevel severe degenerative disc disease with multilevel facet and uncovertebral hypertrophy. Resulting varying degrees of neural foraminal stenosis. Upper chest: Visualized lung apices are clear. IMPRESSION: CT cervical spine: 1. Mildly displaced fracture through the right lamina C2. 2. Motion limited study with possible additional fracture extending through the transverse foramen of C2 on the right. Given this finding, recommend CTA to assess for arterial injury. 3. Mild rotation of C1 on C2, which may be positional, but MRI could provide more sensitive evaluation for ligamentous injury if clinically warranted. CT head:  1. Small (2 mm) area of hyperdensity along the anterior left falx appears new from the prior and is concerning for acute subdural hemorrhage. 2. Large right forehead/periorbital contusion without visible acute calvarial fracture 3. Motion limited study. Maxillofacial CT: Significantly motion limited study without obvious evidence of acute fracture. Evaluation of the inferior orbits and midface is particularly limited and nondiagnostic. Given the large right periorbital/forehead contusion, consider follow-up maxillofacial CT to exclude fracture. Findings discussed with Dr. Rocky Crafts via telephone at 4:04 p.m. Electronically Signed   By: Margaretha Sheffield M.D.   On: 09/05/2021 16:06   CT Angio Neck W and/or Wo Contrast  Result Date: 09/05/2021 CLINICAL DATA:  Neck trauma, arterial injury suspected EXAM: CT ANGIOGRAPHY NECK TECHNIQUE: Multidetector CT imaging of the  neck was performed using the standard protocol during bolus administration of intravenous contrast. Multiplanar CT image reconstructions and MIPs were obtained to evaluate the vascular anatomy. Carotid stenosis measurements (when applicable) are obtained utilizing NASCET criteria, using the distal internal carotid diameter as the denominator. RADIATION DOSE REDUCTION: This exam was performed according to the departmental dose-optimization program which includes automated exposure control, adjustment of the mA and/or kV according to patient size and/or use of iterative reconstruction technique. CONTRAST:  14m OMNIPAQUE IOHEXOL 350 MG/ML SOLN COMPARISON:  No prior CTA of the neck, correlation is made with CT head and CT cervical spine 09/05/2021 FINDINGS: Aortic arch: Standard branching. Imaged portion shows no evidence of aneurysm or dissection. No significant stenosis of the major arch vessel origins. Aortic atherosclerosis. Noncalcified plaque in the proximal left subclavian artery causes just below 50% stenosis. Right carotid system: No evidence of dissection, occlusion, or hemodynamically significant stenosis (greater than 50%). Calcifications of the bifurcation and in the proximal right ICA are not hemodynamically significant. Left carotid system: No evidence of dissection, occlusion, or hemodynamically significant stenosis (greater than 50%). Calcifications at the bifurcation and in the proximal left ICA and ECA are not hemodynamically significant. Vertebral arteries: Diminutive right vertebral artery, without evidence of arterial injury as it passes through the right C2 vertebral artery foramen. The right vertebral artery is patent to the vertebrobasilar junction without evidence of dissection, significant stenosis, or occlusion. Moderate narrowing at the origin of the left vertebral artery. The left vertebral artery is otherwise patent to the vertebrobasilar junction without evidence of dissection,  significant stenosis, or occlusion. Skeleton: For cervical spine findings, please see same day CT cervical spine Other neck: Stranding and possible hematoma in the lower right sternocleidomastoid muscle surrounding soft tissues, likely posttraumatic. No active extravasation into the hematoma. Upper chest: Please see same-day CT chest abdomen pelvis. IMPRESSION: 1. No evidence of vascular injury. Moderate narrowing at the origin of the left vertebral artery. No other hemodynamically significant stenosis in the neck. 2. For additional head and cervical spine findings, including known C2 fracture, please see same-day CT head and cervical spine. Electronically Signed   By: AMerilyn BabaM.D.   On: 09/05/2021 19:20   CT CERVICAL SPINE WO CONTRAST  Result Date: 09/05/2021 CLINICAL DATA:  Head trauma, minor (Age >= 65y); Neck trauma (Age >= 65y); Facial trauma, blunt EXAM: CT HEAD WITHOUT CONTRAST CT MAXILLOFACIAL WITHOUT CONTRAST CT CERVICAL SPINE WITHOUT CONTRAST TECHNIQUE: Multidetector CT imaging of the head, cervical spine, and maxillofacial structures were performed using the standard protocol without intravenous contrast. Multiplanar CT image reconstructions of the cervical spine and maxillofacial structures were also generated. RADIATION DOSE REDUCTION: This exam was performed according to the departmental dose-optimization program which includes automated  exposure control, adjustment of the mA and/or kV according to patient size and/or use of iterative reconstruction technique. COMPARISON:  None Available. FINDINGS: CT HEAD FINDINGS Brain: Small (2 mm) area of hyperdensity along the anterior left falx (see series 4, 23) appears new from the prior and is concerning for acute subdural hemorrhage. No evidence of acute infarction, hydrocephalus, extra-axial collection or mass lesion/mass effect. Chronic microvascular ischemic disease and atrophy. Vascular: Calcific intracranial atherosclerosis. Skull: Large right  forehead contusion without acute calvarial fracture. Other: No mastoid effusions. CT MAXILLOFACIAL FINDINGS Osseous: Significantly motion limited evaluation without obvious fracture. Evaluation of the inferior orbits and midface is particularly limited and nondiagnostic Orbits: Motion limited without visible abnormality. Evaluation of the orbits is nondiagnostic inferiorly. Sinuses: Clear. Soft tissues: Large right periorbital/forehead contusion. CT CERVICAL SPINE FINDINGS Alignment: Rotation of C1 on C2. Similar mild degenerative anterolisthesis of C5 on C6. Otherwise, no substantial sagittal subluxation Skull base and vertebrae: Mildly displaced fracture through the right lamina C2 (see series 4, image 17; series 7, image 27). Motion limits assessment with possible nondisplaced fracture extending into the right transverse foramen at C2 (series 4, image 23). Soft tissues and spinal canal: Motion limited without obvious prevertebral fluid or swelling. No visible canal hematoma. Disc levels: Similar multilevel severe degenerative disc disease with multilevel facet and uncovertebral hypertrophy. Resulting varying degrees of neural foraminal stenosis. Upper chest: Visualized lung apices are clear. IMPRESSION: CT cervical spine: 1. Mildly displaced fracture through the right lamina C2. 2. Motion limited study with possible additional fracture extending through the transverse foramen of C2 on the right. Given this finding, recommend CTA to assess for arterial injury. 3. Mild rotation of C1 on C2, which may be positional, but MRI could provide more sensitive evaluation for ligamentous injury if clinically warranted. CT head: 1. Small (2 mm) area of hyperdensity along the anterior left falx appears new from the prior and is concerning for acute subdural hemorrhage. 2. Large right forehead/periorbital contusion without visible acute calvarial fracture 3. Motion limited study. Maxillofacial CT: Significantly motion limited  study without obvious evidence of acute fracture. Evaluation of the inferior orbits and midface is particularly limited and nondiagnostic. Given the large right periorbital/forehead contusion, consider follow-up maxillofacial CT to exclude fracture. Findings discussed with Dr. Rocky Crafts via telephone at 4:04 p.m. Electronically Signed   By: Margaretha Sheffield M.D.   On: 09/05/2021 16:06   DG Pelvis Portable  Result Date: 09/05/2021 CLINICAL DATA:  Trauma. EXAM: PORTABLE PELVIS 1-2 VIEWS COMPARISON:  None Available. FINDINGS: There is no evidence of pelvic fracture or diastasis. No pelvic bone lesions are seen. IMPRESSION: Negative. Electronically Signed   By: Marijo Conception M.D.   On: 09/05/2021 14:26   CT CHEST ABDOMEN PELVIS W CONTRAST  Result Date: 09/05/2021 CLINICAL DATA:  Unwitnessed fall. EXAM: CT CHEST, ABDOMEN, AND PELVIS WITH CONTRAST TECHNIQUE: Multidetector CT imaging of the chest, abdomen and pelvis was performed following the standard protocol during bolus administration of intravenous contrast. RADIATION DOSE REDUCTION: This exam was performed according to the departmental dose-optimization program which includes automated exposure control, adjustment of the mA and/or kV according to patient size and/or use of iterative reconstruction technique. CONTRAST:  21m OMNIPAQUE IOHEXOL 300 MG/ML  SOLN COMPARISON:  None Available. FINDINGS: CT CHEST FINDINGS Cardiovascular: Cardiac enlargement. No pericardial effusion. Aortic atherosclerosis and coronary artery calcifications. Mediastinum/Nodes: Soft tissue stranding along the right lower neck is identified which may reflect sequelae of contusion, image 9/3. Thyroid gland, trachea, and esophagus are unremarkable.  No enlarged axillary, supraclavicular, mediastinal, or hilar lymph nodes. Lungs/Pleura: Small to moderate right pleural effusion is identified. This is low in attenuation measuring 12 Hounsfield units, image 45/3. Trace left pleural effusion  is also noted. Atelectasis and ground-glass attenuation overlying the right pleural effusion is identified. No pneumothorax or signs of contusion. No suspicious lung nodules. Musculoskeletal: No acute osseous findings. Remote left posterior rib fracture deformities appear healed. No acute rib fractures identified bilaterally. CT ABDOMEN PELVIS FINDINGS Hepatobiliary: No hepatic injury or perihepatic hematoma. Status post cholecystectomy. Pancreas: Unremarkable. No pancreatic ductal dilatation or surrounding inflammatory changes. Spleen: No splenic injury or perisplenic hematoma. Adrenals/Urinary Tract: No adrenal hemorrhage or renal injury identified. Bilateral Bosniak class 1 kidney cysts. No follow-up recommended. Bladder is unremarkable. Stomach/Bowel: Stomach is within normal limits. Sigmoid diverticulosis without signs of acute diverticulitis. No evidence of bowel wall thickening, distention, or inflammatory changes. Vascular/Lymphatic: Aortic atherosclerosis without aneurysm. No signs of abdominopelvic adenopathy. Reproductive: Prostate gland enlargement. Other: No free fluid or fluid collections within the abdomen or pelvis. There is subcutaneous soft tissue stranding along the lower ventral pelvic wall centered at the level of the pubic symphysis. Small subcutaneous hematoma is noted measuring 1.3 x 2.2 cm. Soft tissue stranding extends towards the base of the penis. Findings may reflect contusion after fall. Musculoskeletal: There are acute fractures involving the right L1 and L2 transverse processes, image 68/3 and image 75/3. There is an age indeterminate compression fracture involving the T12 vertebra with loss of approximately 50% of the vertebral body height. Increased sclerosis within the vertebral body suggests a subacute or chronic fracture. IMPRESSION: 1. Acute fractures involving the right L1 and L2 transverse processes. 2. Subcutaneous soft tissue stranding along the lower ventral pelvic wall  centered at the level of the pubic symphysis with small subcutaneous hematoma. Findings may reflect contusion secondary to fall. 3. Age indeterminate T12 compression fracture with loss of approximately 50% of the vertebral body height. 4. Subcutaneous soft tissue stranding is noted along the right lower neck, which is concerning for soft tissue contusion. 5. Bilateral pleural effusions, right greater than left. 6. No signs of pulmonary contusion or pneumothorax. No evidence for solid organ injury within the abdomen or pelvis. 7. Aortic Atherosclerosis (ICD10-I70.0). Electronically Signed   By: Kerby Moors M.D.   On: 09/05/2021 16:04   DG Chest Port 1 View  Result Date: 09/05/2021 CLINICAL DATA:  Trauma in a 88 year old male. EXAM: PORTABLE CHEST 1 VIEW COMPARISON:  Comparison is made with examination from January 20, 2016. FINDINGS: Lung volumes are low. Heart size accentuated by portable technique, low lung volumes and AP projection. Mediastinal contours and hilar structures grossly stable accounting for these findings compared to previous imaging. No visible pneumothorax. Bibasilar airspace disease RIGHT greater than LEFT, somewhat indistinct on the RIGHT and partially obscuring RIGHT heart border. LEFT eighth and ninth rib fractures may be recent, not seen previously. Mildly indistinct appearance of the eighth rib fracture with more acute appearance of the ninth rib fracture, may be projectional in terms of the difference. IMPRESSION: 1. Low lung volumes with bibasilar airspace disease RIGHT greater than LEFT. Query aspiration or pneumonia on the RIGHT. 2. LEFT sided rib fractures appear mildly displaced and may be recent/acute. Electronically Signed   By: Zetta Bills M.D.   On: 09/05/2021 14:28   ECHOCARDIOGRAM COMPLETE  Result Date: 09/06/2021    ECHOCARDIOGRAM REPORT   Patient Name:   JORDIE SCHREUR Date of Exam: 09/06/2021 Medical Rec #:  [id number]  Height:       72.0 in Accession #:    [id number]  Weight:       200.0 lb Date of Birth:  [birth year]   BSA:          2.131 m Patient Age:    29 years   BP:           144/84 mmHg Patient Gender: M          HR:           77 bpm. Exam Location:  Inpatient Procedure: 2D Echo, Cardiac Doppler, Color Doppler and Intracardiac            Opacification Agent Indications:    Syncope  History:        Patient has no prior history of Echocardiogram examinations.                 Arrythmias:Atrial Fibrillation; Risk Factors:Hypertension. CKD.  Sonographer:    Clayton Lefort RDCS (AE) Referring Phys: [id number] Royanne Foots [facility]  Sonographer Comments: Suboptimal subcostal window. Patient in cervical collar. IMPRESSIONS  1. Severe RV dysfunctin and elevated PA pressures consider r/o PE in setting of syncope.  2. Left ventricular ejection fraction, by estimation, is 60 to 65%. The left ventricle has normal function. The left ventricle has no regional wall motion abnormalities. Left ventricular diastolic parameters are indeterminate.  3. Right ventricular systolic function is severely reduced. The right ventricular size is severely enlarged.  4. Left atrial size was moderately dilated.  5. Right atrial size was moderately dilated.  6. The mitral valve is abnormal. Mild mitral valve regurgitation. No evidence of mitral stenosis.  7. Tricuspid valve regurgitation is severe.  8. The aortic valve is normal in structure. There is moderate calcification of the aortic valve. There is moderate thickening of the aortic valve. Aortic valve regurgitation is not visualized. Aortic valve sclerosis/calcification is present, without any  evidence of aortic stenosis.  9. Pulmonic valve regurgitation is moderate. 10. The inferior vena cava is normal in size with greater than 50% respiratory variability, suggesting right atrial pressure of 3 mmHg. FINDINGS  Left Ventricle: Left ventricular ejection fraction, by estimation, is 60 to 65%. The left ventricle has normal function. The left ventricle has no regional  wall motion abnormalities. Definity contrast agent was given IV to delineate the left ventricular  endocardial borders. The left ventricular internal cavity size was normal in size. There is no left ventricular hypertrophy. Left ventricular diastolic parameters are indeterminate. Right Ventricle: The right ventricular size is severely enlarged. Right vetricular wall thickness was not assessed. Right ventricular systolic function is severely reduced. Left Atrium: Left atrial size was moderately dilated. Right Atrium: Right atrial size was moderately dilated. Pericardium: There is no evidence of pericardial effusion. Mitral Valve: The mitral valve is abnormal. There is mild thickening of the mitral valve leaflet(s). There is mild calcification of the mitral valve leaflet(s). Mild mitral annular calcification. Mild mitral valve regurgitation. No evidence of mitral valve stenosis. Tricuspid Valve: The tricuspid valve is normal in structure. Tricuspid valve regurgitation is severe. No evidence of tricuspid stenosis. Aortic Valve: The aortic valve is normal in structure. There is moderate calcification of the aortic valve. There is moderate thickening of the aortic valve. Aortic valve regurgitation is not visualized. Aortic valve sclerosis/calcification is present, without any evidence of aortic stenosis. Aortic valve mean gradient measures 1.0 mmHg. Aortic valve peak gradient measures 2.5 mmHg. Aortic valve area, by VTI measures 2.07 cm. Pulmonic Valve: The  pulmonic valve was normal in structure. Pulmonic valve regurgitation is moderate. No evidence of pulmonic stenosis. Aorta: The aortic root is normal in size and structure. Venous: The inferior vena cava is normal in size with greater than 50% respiratory variability, suggesting right atrial pressure of 3 mmHg. IAS/Shunts: No atrial level shunt detected by color flow Doppler. Additional Comments: Severe RV dysfunctin and elevated PA pressures consider r/o PE in  setting of syncope.  LEFT VENTRICLE PLAX 2D LVIDd:         3.90 cm LVIDs:         2.60 cm LV PW:         1.20 cm LV IVS:        1.10 cm LVOT diam:     2.00 cm LV SV:         34 LV SV Index:   16 LVOT Area:     3.14 cm  RIGHT VENTRICLE RV S prime:     9.90 cm/s TAPSE (M-mode): 1.9 cm LEFT ATRIUM           Index        RIGHT ATRIUM           Index LA diam:      4.40 cm 2.07 cm/m   RA Area:     22.10 cm LA Vol (A2C): 48.9 ml 22.95 ml/m  RA Volume:   73.70 ml  34.59 ml/m LA Vol (A4C): 63.2 ml 29.66 ml/m  AORTIC VALVE AV Area (Vmax):    2.23 cm AV Area (Vmean):   2.16 cm AV Area (VTI):     2.07 cm AV Vmax:           79.30 cm/s AV Vmean:          55.900 cm/s AV VTI:            0.164 m AV Peak Grad:      2.5 mmHg AV Mean Grad:      1.0 mmHg LVOT Vmax:         56.20 cm/s LVOT Vmean:        38.400 cm/s LVOT VTI:          0.108 m LVOT/AV VTI ratio: 0.66  AORTA Ao Root diam: 3.10 cm Ao Asc diam:  3.30 cm TRICUSPID VALVE TR Peak grad:   29.6 mmHg TR Vmax:        272.00 cm/s  SHUNTS Systemic VTI:  0.11 m Systemic Diam: 2.00 cm Jenkins Rouge MD Electronically signed by Jenkins Rouge MD Signature Date/Time: 09/06/2021/11:19:38 AM    Final    CT MAXILLOFACIAL WO CONTRAST  Result Date: 09/05/2021 CLINICAL DATA:  Head trauma, minor (Age >= 65y); Neck trauma (Age >= 65y); Facial trauma, blunt EXAM: CT HEAD WITHOUT CONTRAST CT MAXILLOFACIAL WITHOUT CONTRAST CT CERVICAL SPINE WITHOUT CONTRAST TECHNIQUE: Multidetector CT imaging of the head, cervical spine, and maxillofacial structures were performed using the standard protocol without intravenous contrast. Multiplanar CT image reconstructions of the cervical spine and maxillofacial structures were also generated. RADIATION DOSE REDUCTION: This exam was performed according to the departmental dose-optimization program which includes automated exposure control, adjustment of the mA and/or kV according to patient size and/or use of iterative reconstruction technique. COMPARISON:   None Available. FINDINGS: CT HEAD FINDINGS Brain: Small (2 mm) area of hyperdensity along the anterior left falx (see series 4, 23) appears new from the prior and is concerning for acute subdural hemorrhage. No evidence of acute infarction, hydrocephalus, extra-axial collection or mass  lesion/mass effect. Chronic microvascular ischemic disease and atrophy. Vascular: Calcific intracranial atherosclerosis. Skull: Large right forehead contusion without acute calvarial fracture. Other: No mastoid effusions. CT MAXILLOFACIAL FINDINGS Osseous: Significantly motion limited evaluation without obvious fracture. Evaluation of the inferior orbits and midface is particularly limited and nondiagnostic Orbits: Motion limited without visible abnormality. Evaluation of the orbits is nondiagnostic inferiorly. Sinuses: Clear. Soft tissues: Large right periorbital/forehead contusion. CT CERVICAL SPINE FINDINGS Alignment: Rotation of C1 on C2. Similar mild degenerative anterolisthesis of C5 on C6. Otherwise, no substantial sagittal subluxation Skull base and vertebrae: Mildly displaced fracture through the right lamina C2 (see series 4, image 17; series 7, image 27). Motion limits assessment with possible nondisplaced fracture extending into the right transverse foramen at C2 (series 4, image 23). Soft tissues and spinal canal: Motion limited without obvious prevertebral fluid or swelling. No visible canal hematoma. Disc levels: Similar multilevel severe degenerative disc disease with multilevel facet and uncovertebral hypertrophy. Resulting varying degrees of neural foraminal stenosis. Upper chest: Visualized lung apices are clear. IMPRESSION: CT cervical spine: 1. Mildly displaced fracture through the right lamina C2. 2. Motion limited study with possible additional fracture extending through the transverse foramen of C2 on the right. Given this finding, recommend CTA to assess for arterial injury. 3. Mild rotation of C1 on C2, which  may be positional, but MRI could provide more sensitive evaluation for ligamentous injury if clinically warranted. CT head: 1. Small (2 mm) area of hyperdensity along the anterior left falx appears new from the prior and is concerning for acute subdural hemorrhage. 2. Large right forehead/periorbital contusion without visible acute calvarial fracture 3. Motion limited study. Maxillofacial CT: Significantly motion limited study without obvious evidence of acute fracture. Evaluation of the inferior orbits and midface is particularly limited and nondiagnostic. Given the large right periorbital/forehead contusion, consider follow-up maxillofacial CT to exclude fracture. Findings discussed with Dr. Rocky Crafts via telephone at 4:04 p.m. Electronically Signed   By: Margaretha Sheffield M.D.   On: 09/05/2021 16:06    Scheduled Meds:  lisinopril  20 mg Oral Daily   And   hydrochlorothiazide  25 mg Oral Daily   mouth rinse  15 mL Mouth Rinse BID   metoprolol succinate  25 mg Oral Daily   pantoprazole  40 mg Oral Daily   PARoxetine  10 mg Oral Daily   Continuous Infusions:  sodium chloride      ceFAZolin (ANCEF) IV Stopped (09/07/21 0551)   sodium chloride       LOS: 2 days   Time spent: 40  minutes   Aldena Worm Loann Quill, MD [facility]  If 7PM-7AM, please contact night-coverage [email or web address] 09/07/2021, 11:03 AM

## 2021-09-07 NOTE — Progress Notes (Signed)
Palliative Medicine Progress Note   Patient Name: Jesse Benjamin       Date: 09/07/2021 DOB: [birth year]  Age: 88 y.o. MRN#: [id number] Attending Physician: Mckinley Jewel, MD Primary Care Physician: London Pepper, MD Admit Date: 09/05/2021    HPI/Patient Profile: 88 y.o. male  with past medical history of advanced dementia, frequent falls, hypertension, atrial fibrillation, CKD stage 3, GERD admitted on 09/05/2021 with fall resulting in fractures of C2 right lamina and possible transverse foramen, right L1-L2 transverse process, left ribs as well as SDH. Also treating for BLE cellulitis.   Subjective: Chart reviewed and update received from bedside RN. Patient received a dose of prn haldol due to agitation.  I visited Jesse Benjamin at bedside. On my assessment, he is somnolent and does not follow commands. Facial grimacing noted.   I met with Jesse Benjamin daughter Jesse Benjamin and son-in-law Jesse Benjamin in the waiting area outside [facility]. Discussed that Jesse Benjamin remains NPO because he was not awake/alert enough for a full evaluation by SLP yesterday. Discussed this was likely due to medication given to treat agitation.    Jesse Benjamin feels that her father has made some improvement since yesterday. She wants to focus on the positive, but realizes/states there are "a lot of negatives" with his current medical condition. She remains hopeful for improvement.  For now, she is clear in wanting to continue current medical interventions with watchful waiting.  Discussed the need for continued conversation with family and the medical team regarding overall plan of care and treatment options. Jesse Benjamin is open to additional [place] discussions as needed.    Objective:  Physical Exam Vitals reviewed.  Constitutional:      General: He is  not in acute distress.    Appearance: He is ill-appearing.     Comments: somnolent  HENT:     Head:     Comments: Hard of hearing C-collar in place Pulmonary:     Effort: Pulmonary effort is normal.  Neurological:     Comments: Does not follow commands            Vital Signs: BP 120/67 (BP Location: Left Arm)   Pulse 91   Temp (!) 97.3 F (36.3 C) (Axillary)   Resp 18   SpO2 91%  SpO2: SpO2: 91 % O2 Device: O2 Device: Nasal Cannula O2  Flow Rate: O2 Flow Rate (L/min): 2 L/min   LBM: Last BM Date :  (PTA)     Palliative Medicine Assessment & Plan   Assessment: Principal Problem:   Fall Active Problems:   Atrial fibrillation, chronic (HCC)   Essential hypertension   Cellulitis   CKD (chronic kidney disease), stage III (HCC)   Dementia with behavioral disturbance (HCC)   GERD (gastroesophageal reflux disease)   Subdural hematoma (HCC)   Rib fractures   Lumbar transverse process fracture (HCC)   C2 cervical fracture (HCC)    Recommendations/Plan: DNR/DNI as previously documented Continue current interventions with watchful waiting PMT will continue to follow  Prognosis:  overall poor  Discharge Planning: To Be Determined    Thank you for allowing the Palliative Medicine Team to assist in the care of this patient.   MDM - moderate   Lavena Bullion, NP   Please contact Palliative Medicine Team phone at [phone] for questions and concerns.  For individual providers, please see AMION.

## 2021-09-07 NOTE — Progress Notes (Signed)
Speech Language Pathology Treatment: Dysphagia  Patient Details Name: Jesse Benjamin MRN: [id number] DOB: 07-19-1920 Today's Date: 09/07/2021 Time: 4970-2637 SLP Time Calculation (min) (ACUTE ONLY): 33 min  Assessment / Plan / Recommendation Clinical Impression  F/u after yesterday's initial swallow evaluation.  Jesse Benjamin was much more alert today. His daughter and son-in-law were at bedside.  HOB was elevated.  Pt very HOH.  He accepted ice chips, sips of water, bites of pudding and sips of honey-thick liquids. Thin liquids/ice elicited immediate cough response; purees and honey thick liquids led to fewer episodes of coughing.  As session progressed he demonstrated improved attentiveness and improved toleration. D/W Jesse Benjamin daughter his baseline swallowing (c/b coughing/regurgitation of phlegm during meals) and new concerns for potential aspiration given his mentation and overall weakness.  After discussion, plan was made to proceed cautiously with dysphagia 1 diet, honey thick liquids.  Jesse Benjamin will require full assistance with feeding. Please feed only when alert and hold POs if he begins coughing. Will plan for MBS next date as scheduling in radiology allows.     HPI HPI: Pt is a 88 y.o. male brought to the ED after he was found down at home after his daughter and her husband returned home after having lunch.  It is not entirely certain what happened, but he was near the stairs and they assumed that it must have been related to him trying to climb the stairs. CT head (5/26) concerning for acute subdural hemorrhage. CXR (5/26) revealed "Low lung volumes with bibasilar airspace disease RIGHT greater than LEFT. Query aspiration or pneumonia on the RIGHT". RN concerned with pt coughing with sips of water and BSE ordered. PMH: hypertension, atrial fibrillation, CKD stage III, advanced dementia, frequent falls, and GERD.      SLP Plan  Continue with current plan of care;MBS      Recommendations  for follow up therapy are one component of a multi-disciplinary discharge planning process, led by the attending physician.  Recommendations may be updated based on patient status, additional functional criteria and insurance authorization.    Recommendations  Diet recommendations: Dysphagia 1 (puree);Honey-thick liquid Liquids provided via: Cup;Straw Medication Administration: Crushed with puree Supervision: Full supervision/cueing for compensatory strategies Compensations: Small sips/bites Postural Changes and/or Swallow Maneuvers: Seated upright 90 degrees                Oral Care Recommendations: Oral care BID Follow Up Recommendations: Other (comment) (tba) Assistance recommended at discharge: Frequent or constant Supervision/Assistance SLP Visit Diagnosis: Dysphagia, unspecified (R13.10) Plan: Continue with current plan of care;MBS         Jesse Benjamin, Jesse Benjamin CCC/SLP Acute Rehabilitation Services Office number [phone] Pager [phone]   Assunta Curtis  09/07/2021, 12:17 PM

## 2021-09-08 DIAGNOSIS — I482 Chronic atrial fibrillation, unspecified: Secondary | ICD-10-CM | POA: Diagnosis not present

## 2021-09-08 DIAGNOSIS — F03918 Unspecified dementia, unspecified severity, with other behavioral disturbance: Secondary | ICD-10-CM | POA: Diagnosis not present

## 2021-09-08 DIAGNOSIS — Z515 Encounter for palliative care: Secondary | ICD-10-CM | POA: Diagnosis not present

## 2021-09-08 DIAGNOSIS — Z7189 Other specified counseling: Secondary | ICD-10-CM | POA: Diagnosis not present

## 2021-09-08 DIAGNOSIS — S12101A Unspecified nondisplaced fracture of second cervical vertebra, initial encounter for closed fracture: Secondary | ICD-10-CM | POA: Diagnosis not present

## 2021-09-08 DIAGNOSIS — W19XXXD Unspecified fall, subsequent encounter: Secondary | ICD-10-CM | POA: Diagnosis not present

## 2021-09-08 LAB — CBC WITH DIFFERENTIAL/PLATELET
Abs Immature Granulocytes: 0.5 10*3/uL — ABNORMAL HIGH (ref 0.00–0.07)
Basophils Absolute: 0 10*3/uL (ref 0.0–0.1)
Basophils Relative: 0 %
Eosinophils Absolute: 0 10*3/uL (ref 0.0–0.5)
Eosinophils Relative: 0 %
HCT: 32.9 % — ABNORMAL LOW (ref 39.0–52.0)
Hemoglobin: 10.4 g/dL — ABNORMAL LOW (ref 13.0–17.0)
Lymphocytes Relative: 7 %
Lymphs Abs: 0.9 10*3/uL (ref 0.7–4.0)
MCH: 32.4 pg (ref 26.0–34.0)
MCHC: 31.6 g/dL (ref 30.0–36.0)
MCV: 102.5 fL — ABNORMAL HIGH (ref 80.0–100.0)
Metamyelocytes Relative: 4 %
Monocytes Absolute: 2.9 10*3/uL — ABNORMAL HIGH (ref 0.1–1.0)
Monocytes Relative: 22 %
Neutro Abs: 8.9 10*3/uL — ABNORMAL HIGH (ref 1.7–7.7)
Neutrophils Relative %: 67 %
Platelets: 236 10*3/uL (ref 150–400)
RBC: 3.21 MIL/uL — ABNORMAL LOW (ref 4.22–5.81)
RDW: 16.8 % — ABNORMAL HIGH (ref 11.5–15.5)
WBC: 13.3 10*3/uL — ABNORMAL HIGH (ref 4.0–10.5)
nRBC: 0 % (ref 0.0–0.2)
nRBC: 0 /100 WBC

## 2021-09-08 LAB — BASIC METABOLIC PANEL
Anion gap: 8 (ref 5–15)
BUN: 26 mg/dL — ABNORMAL HIGH (ref 8–23)
CO2: 26 mmol/L (ref 22–32)
Calcium: 9 mg/dL (ref 8.9–10.3)
Chloride: 107 mmol/L (ref 98–111)
Creatinine, Ser: 1.32 mg/dL — ABNORMAL HIGH (ref 0.61–1.24)
GFR, Estimated: 49 mL/min — ABNORMAL LOW (ref >60)
Glucose, Bld: 99 mg/dL (ref 70–99)
Potassium: 3.5 mmol/L (ref 3.5–5.1)
Sodium: 141 mmol/L (ref 135–145)

## 2021-09-08 LAB — MAGNESIUM: Magnesium: 1.8 mg/dL (ref 1.7–2.4)

## 2021-09-08 MED ORDER — BIOTENE DRY MOUTH MT LIQD: 15.0000 mL | OROMUCOSAL | Status: DC | PRN

## 2021-09-08 MED ORDER — WHITE PETROLATUM EX OINT
TOPICAL_OINTMENT | CUTANEOUS | Status: DC | PRN
  Filled 2021-09-08: qty 28.35

## 2021-09-08 MED ORDER — POLYVINYL ALCOHOL 1.4 % OP SOLN: 1.0000 [drp] | Freq: Four times a day (QID) | OPHTHALMIC | Status: DC | PRN

## 2021-09-08 MED ORDER — GLYCOPYRROLATE 0.2 MG/ML IJ SOLN
0.2000 mg | INTRAMUSCULAR | Status: DC | PRN
Start: 2021-09-08 — End: 2021-09-09

## 2021-09-08 MED ORDER — GLYCOPYRROLATE 1 MG PO TABS: 1.0000 mg | ORAL_TABLET | ORAL | Status: DC | PRN

## 2021-09-08 MED ORDER — GLYCOPYRROLATE 0.2 MG/ML IJ SOLN: 0.2000 mg | INTRAMUSCULAR | Status: DC | PRN

## 2021-09-08 NOTE — Progress Notes (Signed)
SLP note  Pt has transitioned to comfort care.  Our service will respectfully sign off.  Mykale Gandolfo L. Tivis Ringer, [place] Office number [phone] Pager [phone]

## 2021-09-08 NOTE — Progress Notes (Signed)
Pt very sleepy this morning, achieves adequate alertness to open mouth for pos but fall right back asleep. RN attempted to administer PO however pt coughs frequently. RN is holding po meds for now. Speech currently at bedside. Will reassess again this afternoon.

## 2021-09-08 NOTE — Evaluation (Signed)
Physical Therapy Evaluation Patient Details Name: Jesse Benjamin MRN: [id number] DOB: [birth year] Today's Date: 09/08/2021  History of Present Illness  88 y.o. male  with past medical history of advanced dementia, frequent falls, hypertension, atrial fibrillation, CKD stage 3, GERD admitted on 09/05/2021 with fall resulting in fractures of C2 right lamina and possible transverse foramen, right L1-L2 transverse process, left ribs as well as SDH. Also treating for BLE cellulitis.  Clinical Impression  Patient presents dependent for mobility with limited arousal, poor activity tolerance and stiffness throughout extremities.  His daughter reports patient able to use walker to get to bathroom on his own at home, however currently +2 total A for rolling in bed.  Palliative care practitioner in the room speaking with family during session and noted PT order cancelled.  Will sign off.       Recommendations for follow up therapy are one component of a multi-disciplinary discharge planning process, led by the attending physician.  Recommendations may be updated based on patient status, additional functional criteria and insurance authorization.  Follow Up Recommendations No PT follow up    Assistance Recommended at Discharge Frequent or constant Supervision/Assistance  Patient can return home with the following  Other (comment) (dependent for all mobility/activities)    Equipment Recommendations None recommended by PT  Recommendations for Other Services       Functional Status Assessment Patient has had a recent decline in their functional status and/or demonstrates limited ability to make significant improvements in function in a reasonable and predictable amount of time     Precautions / Restrictions Precautions Precautions: Fall;Back;Cervical Precaution Booklet Issued: No Precaution Comments: falls x 3 in past 6 months Required Braces or Orthoses: Cervical Brace Cervical Brace: Hard collar (as  tolerated)      Mobility  Bed Mobility Overal bed mobility: Needs Assistance Bed Mobility: Rolling Rolling: Total assist, +2 for physical assistance         General bed mobility comments: rolling in bed to check skin and for positioning and assist to scoot up in bed    Transfers                        Ambulation/Gait                  Stairs            Wheelchair Mobility    Modified Rankin (Stroke Patients Only)       Balance                                             Pertinent Vitals/Pain Pain Assessment Pain Assessment: Faces Faces Pain Scale: Hurts even more Pain Location: generalized with movement Pain Descriptors / Indicators: Guarding, Grimacing, Moaning Pain Intervention(s): Monitored during session, Limited activity within patient's tolerance, Repositioned    Home Living Family/patient expects to be discharged to:: Private residence Living Arrangements: Children Available Help at Discharge: Family Type of Home: House Home Access: Ramped entrance     Alternate Level Stairs-Number of Steps: 2 steps down into dining room Home Layout: Two level Home Equipment: Conservation officer, nature (2 wheels);Other (comment);Wheelchair - manual Additional Comments: lift chair    Prior Function Prior Level of Function : Needs assist             Mobility Comments: able to walk to bathroom, bedroom and  living room with walker, has fallen three other times in 6 months once with arm skin injury ADLs Comments: toilets himself, help to bathe via sponge bath, able to feed himself but had issues with reflux     Hand Dominance        Extremity/Trunk Assessment   Upper Extremity Assessment Upper Extremity Assessment: Defer to OT evaluation    Lower Extremity Assessment Lower Extremity Assessment: LLE deficits/detail;RLE deficits/detail RLE Deficits / Details: not moving to command today, extremely stiff with PROM ankles, hips  and knees RLE Coordination: decreased gross motor LLE Deficits / Details: not moving to command today, extremely stiff with PROM ankles, hips and knees LLE Coordination: decreased gross motor    Cervical / Trunk Assessment Cervical / Trunk Assessment: Other exceptions Cervical / Trunk Exceptions: c-collar  Communication   Communication: HOH (L ear is good ear, 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, 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)  Cognition Arousal/Alertness: Lethargic Behavior During Therapy: Flat affect Overall Cognitive Status: Difficult to assess                                 General Comments: family in the room, attempting to engage with pt only coherent verbalization was "wait a minute"        General Comments General comments (skin integrity, edema, etc.): noted edema and ecchymosis L hand with pt moaning when touched, MD messaged regarding no images noted; Bilat LE's with crustations and errythema    Exercises     Assessment/Plan    PT Assessment Patient does not need any further PT services  PT Problem List Decreased strength;Decreased mobility;Decreased activity tolerance;Decreased balance;Decreased knowledge of use of DME;Pain;Decreased knowledge of precautions       PT Treatment Interventions      PT Goals (Current goals can be found in the Care Plan section)  Acute Rehab PT Goals PT Goal Formulation: All assessment and education complete, DC therapy    Frequency       Co-evaluation PT/OT/SLP Co-Evaluation/Treatment: Yes Reason for Co-Treatment: Necessary to address cognition/behavior during functional activity;Complexity of the patient's impairments (multi-system involvement);For patient/therapist safety           AM-PAC PT "6 Clicks" Mobility  Outcome Measure Help needed turning from your back to your side while in a flat bed without using bedrails?: Total Help needed moving from lying on your back to sitting on the side of a flat bed without using  bedrails?: Total Help needed moving to and from a bed to a chair (including a wheelchair)?: Total Help needed standing up from a chair using your arms (e.g., wheelchair or bedside chair)?: Total Help needed to walk in hospital room?: Total Help needed climbing 3-5 steps with a railing? : Total 6 Click Score: 6    End of Session   Activity Tolerance: Patient limited by pain Patient left: with family/visitor present;with bed alarm set;in bed   PT Visit Diagnosis: Other abnormalities of gait and mobility (R26.89);Muscle weakness (generalized) (M62.81);History of falling (Z91.81);Other symptoms and signs involving the nervous system (R29.898)    Time: 7412-8786 PT Time Calculation (min) (ACUTE ONLY): 38 min   Charges:   PT Evaluation $PT Eval Moderate Complexity: 1 Mod          Cyndi Yanelli Zapanta, PT Acute Rehabilitation Services [facility]:[phone] Office:[phone] 09/08/2021   Reginia Naas 09/08/2021, 12:48 PM

## 2021-09-08 NOTE — Care Management Important Message (Signed)
Important Message  Patient Details  Name: Jesse Benjamin MRN: [id number] Date of Birth: 1921-03-12   Medicare Important Message Given:  Yes     Orbie Pyo 09/08/2021, 2:03 PM

## 2021-09-08 NOTE — Progress Notes (Signed)
PROGRESS NOTE    Jesse Benjamin  ZOX:[id number] DOB: 06/30/1920 DOA: 09/05/2021 PCP: London Pepper, MD   Brief Narrative:  Jesse Benjamin is a 88 y.o. male with medical history significant for hypertension, atrial fibrillation, CKD stage III, advanced dementia, frequent falls, and GERD.  He was brought to the ED after he was found down at home after his daughter and her husband returned home after having lunch.   ED Course: Vital signs stable with some mild blood pressure elevations noted.  Mild leukocytosis of 11,800 and hemoglobin 11.2.  Creatinine appears to be at baseline at 1.4 and lactic acid is 1.2.  Multiple imaging studies performed with noted C2 lamina fracture as well as L1 and L2 transverse process fractures as well as left-sided rib fractures.  Acute subdural hematoma also present on CT scan.  Trauma surgery has been contacted for further evaluation and patient is currently in c-collar.  Neurosurgery has also been contacted for further evaluation.  He started on Ancef for bilateral lower extremity cellulitis.  Assessment & Plan:  Fall-initial encounter -Noted fractures to cervical spine, lumbar spine as well as rib fractures along with subdural hemorrhage -Appreciate trauma service evaluation with no anticipated surgical intervention at this point-signed off -Hard c-collar in place-switched to soft collar -failed swallow eval-He is NPO  Acute subdural hematoma: Right periorbital edema and hematoma: -Appreciate neurosurgery evaluation.  CTA negative -Neurosurgery recommended SDH is nonoperative, no follow-up study needed.  Okay for DVT chemoprophylaxis at 48 hours posttrauma -Neurosurgery signed off   Possible syncopal episode -Monitor on telemetry -2 D echocardiogram shows ejection fraction of 60 to 65% with indeterminate left ventricular diastolic parameters.  RV systolic function is severely reduced with size is severely enlarged.  Mild MR, severe TR aortic valve  sclerosis.  bilateral lower extremity cellulitis -Continue on Ancef -He is afebrile with leukocytosis of 13.3 -Blood culture no growth till date  Poor p.o. intake: -Evaluated by SLP recommended n.p.o. at this time. -No need for MBS -cont. IVF  History of dementia without behavioral disturbances: -Haldol as needed -Delirium precautions   Macrocytic anemia: -H&H is stable.  Normal B12, folate, iron panel   History of atrial fibrillation -On metoprolol at home.  Currently in sinus rhythm. -Hold on metoprolol at this time   Hypertension -Patient is currently stable.  Hold p.o. antihypertensive at this time as patient is NPO.   CKD stage IIIa -Currently near baseline creatinine levels when compared to lab work 5 years ago -cont. IVF   GERD -Hold PPI   Anxiety/depression -Hold home Paxil  Goals of care: Overall poor prognosis. Palliative care consulted.  Family decided to move forward with comfort focused care.  Hospice liaison to speak with family about transition to hospice facility likely tomorrow.  Family would like to continue IV fluids and antibiotics at this time.    DVT prophylaxis: SCD Code Status: DNR Family Communication: Patient's daughter and son-in-law present at the bedside.  Plan of care discussed with patient's family and they both verbalized understanding.    Disposition Plan: Hospice facility  Consultants:  Trauma surgery Neurosurgery Palliative care  Procedures:  None  Antimicrobials:  Ancef  Status is: Inpatient    Subjective: Patient seen and examined.  He will opened his eyes briefly with verbal command.  No fever or chills.  No acute events overnight.  Objective: Vitals:   09/07/21 2319 09/08/21 0259 09/08/21 0802 09/08/21 1000  BP:   137/81   Pulse:   95   Resp:  11   Temp: 98.3 F (36.8 C) 98.6 F (37 C) 98.5 F (36.9 C)   TempSrc: Axillary Axillary Axillary   SpO2:   96%   Weight:    90.7 kg    Intake/Output Summary  (Last 24 hours) at 09/08/2021 1350 Last data filed at 09/08/2021 1050 Gross per 24 hour  Intake 1625.98 ml  Output --  Net 1625.98 ml    Filed Weights   09/08/21 1000  Weight: 90.7 kg    Examination:  General exam: Appears calm, has nasal cannula, elderly male, appears weak, sick and dehydrated Respiratory system: Clear to auscultation. Respiratory effort normal. Cardiovascular system: S1 & S2 heard, RRR. No JVD, murmurs, rubs, gallops or clicks. No pedal edema. Gastrointestinal system: Abdomen is nondistended, soft and nontender. No organomegaly or masses felt. Normal bowel sounds heard. Central nervous system: Sleepy but arousable with verbal command.  Extremities: Move all 4 extremities appropriately Skin: Has large right periorbital hematoma/bruise Dried blood noted on right ear.   Bilateral lower extremities: Erythematous, dry thick skin plaques noted in bilateral lower extremities. Psychiatry: Unable to assess at this time   Data Reviewed: I have personally reviewed following labs and imaging studies  CBC: Recent Labs  Lab 09/05/21 1422 09/05/21 1453 09/06/21 0255 09/07/21 0214 09/08/21 0259  WBC 11.8*  --  11.7* 11.8* 13.3*  NEUTROABS  --   --   --   --  8.9*  HGB 11.4* 11.2* 10.5* 10.4* 10.4*  HCT 35.9* 33.0* 33.2* 33.7* 32.9*  MCV 104.7*  --  100.9* 103.1* 102.5*  PLT 238  --  256 250 829    Basic Metabolic Panel: Recent Labs  Lab 09/05/21 1422 09/05/21 1453 09/06/21 0255 09/07/21 0214 09/08/21 0259  NA 139 138 137 140 141  K 3.8 3.7 4.4 3.9 3.5  CL 105 104 103 107 107  CO2 23  --  '24 25 26  '$ GLUCOSE 188* 189* 126* 116* 99  BUN '21 22 20 '$ 24* 26*  CREATININE 1.34* 1.40* 1.37* 1.33* 1.32*  CALCIUM 9.3  --  9.1 8.8* 9.0  MG  --   --  1.8  --  1.8    GFR: Estimated Creatinine Clearance: 34.3 mL/min (A) (by C-G formula based on SCr of 1.32 mg/dL (H)). Liver Function Tests: Recent Labs  Lab 09/05/21 1422 09/06/21 0255  AST 28 37  ALT 13 13   ALKPHOS 75 73  BILITOT 1.9* 2.1*  PROT 7.1 6.9  ALBUMIN 3.4* 3.2*    No results for input(s): LIPASE, AMYLASE in the last 168 hours. No results for input(s): AMMONIA in the last 168 hours. Coagulation Profile: Recent Labs  Lab 09/05/21 1422  INR 1.4*    Cardiac Enzymes: Recent Labs  Lab 09/05/21 1422  CKTOTAL 160    BNP (last 3 results) No results for input(s): PROBNP in the last 8760 hours. HbA1C: No results for input(s): HGBA1C in the last 72 hours. CBG: No results for input(s): GLUCAP in the last 168 hours. Lipid Profile: No results for input(s): CHOL, HDL, LDLCALC, TRIG, CHOLHDL, LDLDIRECT in the last 72 hours. Thyroid Function Tests: No results for input(s): TSH, T4TOTAL, FREET4, T3FREE, THYROIDAB in the last 72 hours. Anemia Panel: Recent Labs    09/05/21 2021  VITAMINB12 668  FOLATE >40.0  FERRITIN 38  TIBC 374  IRON 58  RETICCTPCT 4.2*    Sepsis Labs: Recent Labs  Lab 09/05/21 1422  LATICACIDVEN 2.1*     Recent Results (from the past 240  hour(s))  Culture, blood (routine x 2)     Status: None (Preliminary result)   Collection Time: 09/05/21  5:49 PM   Specimen: BLOOD  Result Value Ref Range Status   Specimen Description BLOOD BLOOD LEFT HAND  Final   Special Requests   Final    BOTTLES DRAWN AEROBIC AND ANAEROBIC Blood Culture adequate volume   Culture   Final    NO GROWTH 3 DAYS Performed at [facility], [address] [place], [place] [address]    Report Status PENDING  Incomplete  Culture, blood (routine x 2)     Status: None (Preliminary result)   Collection Time: 09/05/21  8:21 PM   Specimen: BLOOD  Result Value Ref Range Status   Specimen Description BLOOD LEFT ANTECUBITAL  Final   Special Requests   Final    BOTTLES DRAWN AEROBIC AND ANAEROBIC Blood Culture results may not be optimal due to an inadequate volume of blood received in culture bottles   Culture   Final    NO GROWTH 3 DAYS Performed at [facility], [place] [address] [place], [place] [address]    Report Status PENDING  Incomplete       Radiology Studies: No results found.  Scheduled Meds:  mouth rinse  15 mL Mouth Rinse BID   Continuous Infusions:  sodium chloride 75 mL/hr at 09/08/21 1050    ceFAZolin (ANCEF) IV 1 g (09/08/21 1307)     LOS: 3 days   Time spent: 40  minutes   Jaylani Mcguinn Loann Quill, MD [facility]  If 7PM-7AM, please contact night-coverage [email or web address] 09/08/2021, 1:50 PM

## 2021-09-08 NOTE — Progress Notes (Signed)
Orthopedic Tech Progress Note Patient Details:  Jesse Benjamin 02/08/1921 [id number]  RN called requesting a soft collar, helped CSPINE him so we could switch out collars. Ortho Devices Type of Ortho Device: Soft collar Ortho Device/Splint Location: neck Ortho Device/Splint Interventions: Ordered, Application, Adjustment   Post Interventions Patient Tolerated: Well Instructions Provided: Care of [place] 09/08/2021, 4:21 PM

## 2021-09-08 NOTE — Care Management Important Message (Signed)
Important Message  Patient Details  Name: Jesse Benjamin MRN: [id number] Date of Birth: Mar 18, 1921   Medicare Important Message Given:  Yes     Cheyane Ayon 09/08/2021, 2:02 PM

## 2021-09-08 NOTE — Progress Notes (Signed)
  Transition of Care [facility]) Screening Note   Patient Details  Name: Jesse Benjamin Date of Birth: 12/08/1920   [facility] CM/SW Contact:    Vinie Sill, LCSW Phone Number: 09/08/2021, 2:32 PM    Transition of Care Department [facility]) has reviewed patient and is following for disposition needs.

## 2021-09-08 NOTE — Progress Notes (Signed)
Manufacturing engineer [facility]) Hospital Liaison Note  Referral received for patient/family interest in [facility]. Chart under review by [facility] physician.  Hospice eligibility pending  Family not interested in moving until tomorrow. Bedside assessment will take place tomorrow morning.   Please call with any questions or concerns.thank you  Roselee Nova, [facility] Liaison  [phone]

## 2021-09-08 NOTE — Progress Notes (Addendum)
Palliative:  HPI: 88 y.o. male  with past medical history of advanced dementia, frequent falls, hypertension, atrial fibrillation, CKD stage 3, GERD admitted on 09/05/2021 with fall resulting in fractures of C2 right lamina and possible transverse foramen, right L1-L2 transverse process, left ribs as well as SDH. Also treating for BLE cellulitis.    I met today at Mr. Privott bedside along with Katharine Look and Al. PT/OT came and worked with Mr. Aikey and he was not really able to participate but he did exhibit discomfort and pain throughout session. I spoke more at length with Katharine Look and Al regarding Mr. Herrington poor prognosis and suffering. We discussed the risks vs benefits of what we ask him to go through. We also reviewed if Mr. Tener from 20 years ago was standing here having this conversation what would he want?? We talked through some of Sandra's fears of hospice and comfort care which is mostly about not having IVF and I explained that this can sometimes cause more discomfort and does not really add to someone's comfort level. We discussed comfort feeds acknowledging risk of aspiration. We discussed transition to soft cervical collar acknowledging risk involved. After discussion and consideration they decide to proceed with more comfort focused care while continuing IVF and antibiotics for now. They would like to begin the process of moving towards hospice facility placement - they are interested in [facility] as this is near their home.   All questions/concerns addressed. Emotional support provided.   Exam: Lethargic. Awakens and briefly speaks to his daughter (hard of hearing). No distress except with movement he seems in much pain with grimacing and groaning and very tense. Swelling to right eye improved but still severe bruising.   Plan: - Moving towards comfort focused care. Continue oxygen as indicated.  - Hopeful for [employer] [facility] placement. Continue IVF and antibiotics until bed found -  they understand this will not be given at hospice. Prognosis < 2 weeks.  - Move to soft cervical collar for comfort.  - Comfort feeds allowed.  - No need for further testing/labs/diagnostics.   [place], NP Palliative Medicine Team Pager [phone] (Please see [email or web address] for schedule) Team Phone [phone]    Greater than 50%  of this time was spent counseling and coordinating care related to the above assessment and plan

## 2021-09-08 NOTE — Progress Notes (Signed)
Speech Language Pathology Treatment: Dysphagia  Patient Details Name: Jesse Benjamin MRN: [id number] DOB: 1920/05/26 Today's Date: 09/08/2021 Time: 6314-9702 SLP Time Calculation (min) (ACUTE ONLY): 10 min  Assessment / Plan / Recommendation Clinical Impression  Mr. Glomski was poorly rousable this am.  RN reports sleepiness and coughing with PO intake.  SLP provided oral care - pt did not awaken, moaned and turned head away during attempts to clean his mouth.  Coughing noted. No POs offered given his mental status today (yesterday during our session he was awake and interactive). Recommend holding tray/oral meds while he is this sleepy. Will cancel MBS for today - spoke with scheduling in radiology; we will assess readiness again tomorrow. D/W RN. SLP will follow.    HPI HPI: Pt is a 88 y.o. male brought to the ED after he was found down at home after his daughter and her husband returned home after having lunch.  It is not entirely certain what happened, but he was near the stairs and they assumed that it must have been related to him trying to climb the stairs. CT head (5/26) concerning for acute subdural hemorrhage. CXR (5/26) revealed "Low lung volumes with bibasilar airspace disease RIGHT greater than LEFT. Query aspiration or pneumonia on the RIGHT". RN concerned with pt coughing with sips of water and BSE ordered. PMH: hypertension, atrial fibrillation, CKD stage III, advanced dementia, frequent falls, and GERD.      SLP Plan  Continue with current plan of care;MBS      Recommendations for follow up therapy are one component of a multi-disciplinary discharge planning process, led by the attending physician.  Recommendations may be updated based on patient status, additional functional criteria and insurance authorization.    Recommendations  Diet recommendations: Hold food tray/ PO meds when not alert.                Oral Care Recommendations: Oral care QID Assistance recommended at  discharge: Frequent or constant Supervision/Assistance SLP Visit Diagnosis: Dysphagia, unspecified (R13.10) Plan: Continue with current plan of care;MBS        Janey Petron L. Tivis Ringer, [place] CCC/SLP Acute Rehabilitation Services Office number [phone] Pager [phone]    Assunta Curtis  09/08/2021, 9:49 AM

## 2021-09-08 NOTE — Evaluation (Addendum)
Occupational Therapy Evaluation Patient Details Name: Jesse Benjamin MRN: [id number] DOB: [birth year] Today's Date: 09/08/2021   History of Present Illness 88 y.o. male  with past medical history of advanced dementia, frequent falls, hypertension, atrial fibrillation, CKD stage 3, GERD admitted on 09/05/2021 with fall resulting in fractures of C2 right lamina and possible transverse foramen, right L1-L2 transverse process, left ribs as well as SDH. Also treating for BLE cellulitis.   Clinical Impression   PTA pt living at home with his daughter/family who assisted with ADL and IADL tasks as needed. At baseline pt ambulatory with the RW in the home and was able to walk to the bathroom and assist with ADL tasks. Pt moaning with eyes closed @ 99% of session with PT. Moaning and not attempting to help at all with rolling or repositioning in bed. Only  words pt expressed was "give me a minute", when attempting to roll. Pt overall total A with all ADL and will be total A +2 for mobility. Pt will need residential care or home with 24/7 support with hoyer. No further acute OT needs.      Recommendations for follow up therapy are one component of a multi-disciplinary discharge planning process, led by the attending physician.  Recommendations may be updated based on patient status, additional functional criteria and insurance authorization.   Follow Up Recommendations  Long-term institutional care without follow-up therapy (or home with hospitce)    Assistance Recommended at Discharge Frequent or constant Supervision/Assistance  Patient can return home with the following Two people to help with walking and/or transfers;Two people to help with bathing/dressing/bathroom;Direct supervision/assist for financial management;Direct supervision/assist for medications management;Assist for transportation;Help with stairs or ramp for entrance    Functional Status Assessment  Patient has had a recent decline in their  functional status and demonstrates the ability to make significant improvements in function in a reasonable and predictable amount of time.  Scientist, water quality; hospital bed   Recommendations for Other Services       Precautions / Restrictions Precautions Precautions: Fall;Back;Cervical Precaution Booklet Issued: No Precaution Comments: falls x 3 in past 6 months Required Braces or Orthoses: Cervical Brace Cervical Brace: Hard collar (as tolerated)      Mobility Bed Mobility Overal bed mobility: Needs Assistance Bed Mobility: Rolling Rolling: Total assist, +2 for physical assistance         General bed mobility comments: rolling in bed to check skin and for positioning and assist to scoot up in bed    Transfers                   General transfer comment: unable to attempt      Balance Overall balance assessment: History of Falls                                         ADL either performed or assessed with clinical judgement   ADL                                         General ADL Comments: total A     Vision   Additional Comments: eyes closed 99% of session; R eye bruised and swollen     Perception     Praxis      Pertinent  Vitals/Pain Pain Assessment Pain Assessment: Faces Faces Pain Scale: Hurts even more Breathing: normal Negative Vocalization: none Facial Expression: smiling or inexpressive Body Language: relaxed Consolability: no need to console PAINAD Score: 0 Pain Location: generalized with movement Pain Descriptors / Indicators: Guarding, Grimacing, Moaning Pain Intervention(s): Limited activity within patient's tolerance     Hand Dominance Right   Extremity/Trunk Assessment Upper Extremity Assessment Upper Extremity Assessment: LUE deficits/detail;RUE deficits/detail;Generalized weakness RUE Deficits / Details: holding UE in protective flexed posture; not attempting to use  functionally LUE Deficits / Details: L hand swollen and bruised; c/o pain with attempts of movement - nsgt & MD notified LUE Coordination: decreased fine motor;decreased gross motor   Lower Extremity Assessment Lower Extremity Assessment: Defer to PT evaluation RLE Deficits / Details: not moving to command today, extremely stiff with PROM ankles, hips and knees RLE Coordination: decreased gross motor LLE Deficits / Details: not moving to command today, extremely stiff with PROM ankles, hips and knees LLE Coordination: decreased gross motor   Cervical / Trunk Assessment Cervical / Trunk Assessment: Other exceptions Cervical / Trunk Exceptions: c-collar   Communication Communication Communication: HOH (L ear is good ear, 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, 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)   Cognition Arousal/Alertness: Lethargic Behavior During Therapy: Flat affect Overall Cognitive Status: Difficult to assess                                 General Comments: family in the room, attempting to engage with pt only coherent verbalization was "wait a minute"     General Comments  B heels floated on pillows; would benfit from B Prevalon boots    Exercises     Shoulder Instructions      Home Living Family/patient expects to be discharged to:: Private residence Living Arrangements: Children Available Help at Discharge: Family Type of Home: House Home Access: Ramped entrance     Home Layout: Two level Alternate Level Stairs-Number of Steps: 2 steps down into dining room   Bathroom Shower/Tub: Sponge bathes at baseline   Bathroom Toilet: Standard Bathroom Accessibility: Yes How Accessible: Accessible via walker Home Equipment: Rolling Walker (2 wheels);Other (comment);Wheelchair - manual   Additional Comments: lift chair      Prior Functioning/Environment Prior Level of Function : Needs assist             Mobility Comments: able to walk to bathroom, bedroom and living room  with walker, has fallen three other times in 6 months once with arm skin injury ADLs Comments: toilets himself, help to bathe via sponge bath, able to feed himself but had issues with reflux        OT Problem List: Decreased strength;Decreased range of motion;Decreased activity tolerance;Impaired balance (sitting and/or standing);Impaired vision/perception;Decreased coordination;Decreased cognition;Decreased safety awareness;Decreased knowledge of use of DME or AE;Decreased knowledge of precautions;Cardiopulmonary status limiting activity;Impaired sensation;Impaired tone;Impaired UE functional use;Pain;Increased edema      OT Treatment/Interventions:      OT Goals(Current goals can be found in the care plan section) Acute Rehab OT Goals Patient Stated Goal: Daughter meeting wtih palliative OT Goal Formulation: All assessment and education complete, DC therapy  OT Frequency:      Co-evaluation PT/OT/SLP Co-Evaluation/Treatment: Yes Reason for Co-Treatment: For patient/therapist safety   OT goals addressed during session: ADL's and self-care      AM-PAC OT "6 Clicks" Daily Activity     Outcome Measure Help from another person eating  meals?: Total Help from another person taking care of personal grooming?: Total Help from another person toileting, which includes using toliet, bedpan, or urinal?: Total Help from another person bathing (including washing, rinsing, drying)?: Total Help from another person to put on and taking off regular upper body clothing?: Total Help from another person to put on and taking off regular lower body clothing?: Total 6 Click Score: 6   End of Session Nurse Communication: Mobility status;Need for lift equipment  Activity Tolerance: Patient limited by pain;Patient limited by lethargy;Patient limited by fatigue Patient left: in bed;with call bell/phone within reach;with bed alarm set;with family/visitor present  OT Visit Diagnosis: Unsteadiness on feet  (R26.81);Muscle weakness (generalized) (M62.81);Feeding difficulties (R63.3);Other symptoms and signs involving the nervous system (R29.898);Other symptoms and signs involving cognitive function;Pain;History of falling (Z91.81) Pain - part of body:  (all over)                Time: 6384-6659 OT Time Calculation (min): 32 min Charges:  OT General Charges $OT Visit: 1 Visit OT Evaluation $OT Eval Moderate Complexity: Bennett, OT/L   Acute OT Clinical Specialist Acute Rehabilitation Services Pager [phone] Office [phone]   [facility] 09/08/2021, 3:21 PM

## 2021-09-09 DIAGNOSIS — S12101D Unspecified nondisplaced fracture of second cervical vertebra, subsequent encounter for fracture with routine healing: Secondary | ICD-10-CM | POA: Diagnosis not present

## 2021-09-09 DIAGNOSIS — W19XXXD Unspecified fall, subsequent encounter: Secondary | ICD-10-CM | POA: Diagnosis not present

## 2021-09-09 DIAGNOSIS — Z515 Encounter for palliative care: Secondary | ICD-10-CM

## 2021-09-09 MED ORDER — WHITE PETROLATUM EX OINT: 1.0000 "application " | TOPICAL_OINTMENT | CUTANEOUS | 0 refills | Status: AC | PRN

## 2021-09-09 MED ORDER — ONDANSETRON HCL 4 MG PO TABS: 4.0000 mg | ORAL_TABLET | Freq: Four times a day (QID) | ORAL | 0 refills | Status: AC | PRN

## 2021-09-09 MED ORDER — HYDROMORPHONE HCL 1 MG/ML IJ SOLN
0.5000 mg | INTRAMUSCULAR | 0 refills | Status: AC | PRN
Start: 2021-09-09 — End: ?

## 2021-09-09 MED ORDER — HALOPERIDOL LACTATE 5 MG/ML IJ SOLN: 2.0000 mg | Freq: Four times a day (QID) | INTRAMUSCULAR | Status: AC | PRN

## 2021-09-09 MED ORDER — GLYCOPYRROLATE 0.2 MG/ML IJ SOLN
0.2000 mg | INTRAMUSCULAR | Status: AC | PRN
Start: 2021-09-09 — End: ?

## 2021-09-09 MED ORDER — ORAL CARE MOUTH RINSE: 15.0000 mL | Freq: Two times a day (BID) | OROMUCOSAL | 0 refills | Status: AC

## 2021-09-09 MED ORDER — GLYCOPYRROLATE 1 MG PO TABS
1.0000 mg | ORAL_TABLET | ORAL | Status: AC | PRN
Start: 2021-09-09 — End: ?

## 2021-09-09 MED ORDER — FOOD THICKENER (SIMPLYTHICK HONEY): 10.0000 | ORAL | Status: AC | PRN

## 2021-09-09 MED ORDER — ONDANSETRON HCL 4 MG/2ML IJ SOLN
4.0000 mg | Freq: Four times a day (QID) | INTRAMUSCULAR | 0 refills | Status: AC | PRN
Start: 2021-09-09 — End: ?

## 2021-09-09 MED ORDER — BIOTENE DRY MOUTH MT LIQD: 15.0000 mL | OROMUCOSAL | Status: AC | PRN

## 2021-09-09 MED ORDER — ACETAMINOPHEN 325 MG PO TABS: 650.0000 mg | ORAL_TABLET | Freq: Four times a day (QID) | ORAL | Status: AC | PRN

## 2021-09-09 MED ORDER — POLYVINYL ALCOHOL 1.4 % OP SOLN: 1.0000 [drp] | Freq: Four times a day (QID) | OPHTHALMIC | 0 refills | Status: AC | PRN

## 2021-09-09 NOTE — Progress Notes (Signed)
IV Dilaudid given prior to transport for comfort.

## 2021-09-09 NOTE — TOC Transition Note (Signed)
Transition of Care [facility]) - CM/SW Discharge Note   Patient Details  Name: Jesse Benjamin MRN: [id number] Date of Birth: [birth year]  Transition of Care [facility]) CM/SW Contact:  Angelita Ingles, RN Phone Number:[phone]  09/09/2021, 2:11 PM   Clinical Narrative:    Patient discharging to [facility]. Transportation has been arranged via [employer]. D/c packet is at the nurses station. No other needs noted at this time.  TOC will sign off.    Final next level of care: [place] Barriers to Discharge: No Barriers Identified   Patient Goals and CMS Choice        Discharge Placement                       Discharge Plan and Services                                     Social Determinants of Health (SDOH) Interventions     Readmission Risk Interventions     View : No data to display.

## 2021-09-09 NOTE — Progress Notes (Signed)
Engineer, maintenance [facility]) Hospital Liaison note.   Received request from [place] for family interest in [facility] with request for transfer today. Chart reviewed and eligibility confirmed.   Spoke to daughter Katharine Look to confirm interest and explain services. Family agreeable to transfer today.. CSW aware.    Registration paperwork will be completed after noon. This liaison will let the TOC know once they are complete. Dr. Orpah Melter to assume care per family request.    RN please call report to [phone].   TOC please arrange transport for patient once consents are complete.   Thank you,   Clementeen Hoof, DNP, BSN    [facility] (listed on [place] under [facility] [phone]   [phone]

## 2021-09-09 NOTE — Discharge Summary (Signed)
Physician Discharge Summary   Patient: Jesse Benjamin MRN: [id number] DOB: 03/10/1921  Admit date:     09/05/2021  Discharge date: 09/09/21  Discharge Physician: Ezekiel Slocumb   PCP: London Pepper, MD   Recommendations at discharge:    Follow up with hospice team at [facility]  Discharge Diagnoses: Principal Problem:   Fall Active Problems:   Atrial fibrillation, chronic ([place])   Essential hypertension   Cellulitis   CKD (chronic kidney disease), stage III ([place])   Dementia with behavioral disturbance (HCC)   GERD (gastroesophageal reflux disease)   Subdural hematoma (HCC)   Rib fractures   Lumbar transverse process fracture (HCC)   C2 cervical fracture ([place])   Hospice care patient  Resolved Problems:   * No resolved hospital problems. *  Hospital Course: Jesse Benjamin is a 88 y.o. male with medical history significant for hypertension, atrial fibrillation, CKD stage III, advanced dementia, frequent falls, and GERD.  He was brought to the ED after he was found down at home after his daughter and her husband returned home after having lunch.    ED Course: Vital signs stable with some mild blood pressure elevations noted.  Mild leukocytosis of 11,800 and hemoglobin 11.2.  Creatinine appears to be at baseline at 1.4 and lactic acid is 1.2.  Multiple imaging studies performed with noted C2 lamina fracture as well as L1 and L2 transverse process fractures as well as left-sided rib fractures.  Acute subdural hematoma also present on CT scan.  Trauma surgery has been contacted for further evaluation and patient is currently in c-collar.  Neurosurgery has also been contacted for further evaluation.  He started on Ancef for bilateral lower extremity cellulitis  Assessment and Plan:  Comfort Care Status -- stable for d/c to [facility] today.b --continue comfort meds per orders --notify hospice provider if any signs of uncontrolled pain, discomfort, distress or  agitation   ========================================   Fall-initial encounter -Noted fractures to cervical spine, lumbar spine as well as rib fractures along with subdural hemorrhage -Appreciate trauma service evaluation with no anticipated surgical intervention at this point-signed off -Hard c-collar in place-switched to soft collar -failed swallow eval-He is NPO   Acute subdural hematoma: Right periorbital edema and hematoma: -Appreciate neurosurgery evaluation.  CTA negative -Neurosurgery recommended SDH is nonoperative, no follow-up study needed.  Okay for DVT chemoprophylaxis at 48 hours posttrauma -Neurosurgery signed off   Possible syncopal episode -Monitor on telemetry -2 D echocardiogram shows ejection fraction of 60 to 65% with indeterminate left ventricular diastolic parameters.  RV systolic function is severely reduced with size is severely enlarged.  Mild MR, severe TR aortic valve sclerosis.   bilateral lower extremity cellulitis -Continue on Ancef -He is afebrile with leukocytosis of 13.3 -Blood culture no growth till date   Poor p.o. intake: -Evaluated by SLP recommended n.p.o. at this time. -No need for MBS -cont. IVF   History of dementia without behavioral disturbances: -Haldol as needed -Delirium precautions   Macrocytic anemia: -H&H is stable.  Normal B12, folate, iron panel   History of atrial fibrillation -On metoprolol at home.  Currently in sinus rhythm. -Hold on metoprolol at this time   Hypertension -Patient is currently stable.  Hold p.o. antihypertensive at this time as patient is NPO.   CKD stage IIIa -Currently near baseline creatinine levels when compared to lab work 5 years ago -treated with IVF   GERD -Hold PPI   Anxiety/depression -Hold home Paxil   Goals of care: Overall  poor prognosis. Palliative care consulted.  Family decided to move forward with comfort focused care.  Hospice liaison to speak with family about transition  to hospice facility likely tomorrow.  Family would like to continue IV fluids and antibiotics at this time.      DVT prophylaxis: SCD Code Status: DNR Family Communication: Patient's daughter and son-in-law present at the bedside.  Plan of care discussed with patient's family and they both verbalized understanding.     Disposition Plan: Hospice facility   Consultants:  Trauma surgery Neurosurgery Palliative care   Procedures:  None        Consultants: above Procedures performed: none  Disposition: Hospice care Diet recommendation:  Discharge Diet Orders (From admission, onward)     Start     Ordered   09/09/21 0000  Diet - low sodium heart healthy        09/09/21 1210           Dysphagia type 1 honey thickened Liquid  DISCHARGE MEDICATION: Allergies as of 09/09/2021   No Known Allergies      Medication List     STOP taking these medications    CRANBERRY PO   lisinopril-hydrochlorothiazide 20-25 MG tablet Commonly known as: ZESTORETIC   metoprolol succinate 25 MG 24 hr tablet Commonly known as: TOPROL-XL   multivitamin with minerals Tabs tablet   omeprazole 20 MG capsule Commonly known as: PRILOSEC   PARoxetine 10 MG tablet Commonly known as: PAXIL       TAKE these medications    acetaminophen 325 MG tablet Commonly known as: TYLENOL Take 2 tablets (650 mg total) by mouth every 6 (six) hours as needed for mild pain (or Fever >/= 101).   antiseptic oral rinse Liqd Apply 15 mLs topically as needed for dry mouth.   mouth rinse Liqd solution 15 mLs by Mouth Rinse route 2 (two) times daily.   food thickener Liqd Commonly known as: SIMPLYTHICK (HONEY/LEVEL 3/MODERATELY THICK) Take 10 packets by mouth as needed.   glycopyrrolate 1 MG tablet Commonly known as: ROBINUL Take 1 tablet (1 mg total) by mouth every 4 (four) hours as needed (excessive secretions).   glycopyrrolate 0.2 MG/ML injection Commonly known as: ROBINUL Inject 1 mL (0.2  mg total) into the skin every 4 (four) hours as needed (excessive secretions).   haloperidol lactate 5 MG/ML injection Commonly known as: HALDOL Inject 0.4 mLs (2 mg total) into the vein every 6 (six) hours as needed.   HYDROmorphone 1 MG/ML injection Commonly known as: DILAUDID Inject 0.5-1 mLs (0.5-1 mg total) into the vein every 2 (two) hours as needed for severe pain.   ondansetron 4 MG tablet Commonly known as: ZOFRAN Take 1 tablet (4 mg total) by mouth every 6 (six) hours as needed for nausea.   ondansetron 4 MG/2ML Soln injection Commonly known as: ZOFRAN Inject 2 mLs (4 mg total) into the vein every 6 (six) hours as needed for nausea.   polyvinyl alcohol 1.4 % ophthalmic solution Commonly known as: LIQUIFILM TEARS Place 1 drop into both eyes 4 (four) times daily as needed for dry eyes.   white petrolatum Oint Commonly known as: VASELINE Apply 1 application. topically as needed for lip care.        Discharge Exam: Filed Weights   09/08/21 1000  Weight: 90.7 kg   General exam: sleeping comfortably, no acute distress HEENT: facial ecchymosis noted, eyes closed Respiratory system: CTAB, no wheezes, rales or rhonchi, normal respiratory effort. Cardiovascular system: normal S1/S2,  RRR, no pedal edema.   Gastrointestinal system: soft, NT, ND, no HSM felt, +bowel sounds. Central nervous system: unable to evaluate due to somnolence Extremities: no cyanosis, warm distal extremities Skin: dry, intact, normal temperature, scattered pathcy ecchymosis Psychiatry: normal mood, congruent affect, judgement and insight appear normal   Condition at discharge: stable  The results of significant diagnostics from this hospitalization (including imaging, microbiology, ancillary and laboratory) are listed below for reference.   Imaging Studies: CT HEAD WO CONTRAST  Result Date: 09/05/2021 CLINICAL DATA:  Head trauma, minor (Age >= 65y); Neck trauma (Age >= 65y); Facial trauma,  blunt EXAM: CT HEAD WITHOUT CONTRAST CT MAXILLOFACIAL WITHOUT CONTRAST CT CERVICAL SPINE WITHOUT CONTRAST TECHNIQUE: Multidetector CT imaging of the head, cervical spine, and maxillofacial structures were performed using the standard protocol without intravenous contrast. Multiplanar CT image reconstructions of the cervical spine and maxillofacial structures were also generated. RADIATION DOSE REDUCTION: This exam was performed according to the departmental dose-optimization program which includes automated exposure control, adjustment of the mA and/or kV according to patient size and/or use of iterative reconstruction technique. COMPARISON:  None Available. FINDINGS: CT HEAD FINDINGS Brain: Small (2 mm) area of hyperdensity along the anterior left falx (see series 4, 23) appears new from the prior and is concerning for acute subdural hemorrhage. No evidence of acute infarction, hydrocephalus, extra-axial collection or mass lesion/mass effect. Chronic microvascular ischemic disease and atrophy. Vascular: Calcific intracranial atherosclerosis. Skull: Large right forehead contusion without acute calvarial fracture. Other: No mastoid effusions. CT MAXILLOFACIAL FINDINGS Osseous: Significantly motion limited evaluation without obvious fracture. Evaluation of the inferior orbits and midface is particularly limited and nondiagnostic Orbits: Motion limited without visible abnormality. Evaluation of the orbits is nondiagnostic inferiorly. Sinuses: Clear. Soft tissues: Large right periorbital/forehead contusion. CT CERVICAL SPINE FINDINGS Alignment: Rotation of C1 on C2. Similar mild degenerative anterolisthesis of C5 on C6. Otherwise, no substantial sagittal subluxation Skull base and vertebrae: Mildly displaced fracture through the right lamina C2 (see series 4, image 17; series 7, image 27). Motion limits assessment with possible nondisplaced fracture extending into the right transverse foramen at C2 (series 4, image 23).  Soft tissues and spinal canal: Motion limited without obvious prevertebral fluid or swelling. No visible canal hematoma. Disc levels: Similar multilevel severe degenerative disc disease with multilevel facet and uncovertebral hypertrophy. Resulting varying degrees of neural foraminal stenosis. Upper chest: Visualized lung apices are clear. IMPRESSION: CT cervical spine: 1. Mildly displaced fracture through the right lamina C2. 2. Motion limited study with possible additional fracture extending through the transverse foramen of C2 on the right. Given this finding, recommend CTA to assess for arterial injury. 3. Mild rotation of C1 on C2, which may be positional, but MRI could provide more sensitive evaluation for ligamentous injury if clinically warranted. CT head: 1. Small (2 mm) area of hyperdensity along the anterior left falx appears new from the prior and is concerning for acute subdural hemorrhage. 2. Large right forehead/periorbital contusion without visible acute calvarial fracture 3. Motion limited study. Maxillofacial CT: Significantly motion limited study without obvious evidence of acute fracture. Evaluation of the inferior orbits and midface is particularly limited and nondiagnostic. Given the large right periorbital/forehead contusion, consider follow-up maxillofacial CT to exclude fracture. Findings discussed with Dr. Rocky Crafts via telephone at 4:04 p.m. Electronically Signed   By: Margaretha Sheffield M.D.   On: 09/05/2021 16:06   CT Angio Neck W and/or Wo Contrast  Result Date: 09/05/2021 CLINICAL DATA:  Neck trauma, arterial injury suspected EXAM:  CT ANGIOGRAPHY NECK TECHNIQUE: Multidetector CT imaging of the neck was performed using the standard protocol during bolus administration of intravenous contrast. Multiplanar CT image reconstructions and MIPs were obtained to evaluate the vascular anatomy. Carotid stenosis measurements (when applicable) are obtained utilizing NASCET criteria, using the  distal internal carotid diameter as the denominator. RADIATION DOSE REDUCTION: This exam was performed according to the departmental dose-optimization program which includes automated exposure control, adjustment of the mA and/or kV according to patient size and/or use of iterative reconstruction technique. CONTRAST:  8m OMNIPAQUE IOHEXOL 350 MG/ML SOLN COMPARISON:  No prior CTA of the neck, correlation is made with CT head and CT cervical spine 09/05/2021 FINDINGS: Aortic arch: Standard branching. Imaged portion shows no evidence of aneurysm or dissection. No significant stenosis of the major arch vessel origins. Aortic atherosclerosis. Noncalcified plaque in the proximal left subclavian artery causes just below 50% stenosis. Right carotid system: No evidence of dissection, occlusion, or hemodynamically significant stenosis (greater than 50%). Calcifications of the bifurcation and in the proximal right ICA are not hemodynamically significant. Left carotid system: No evidence of dissection, occlusion, or hemodynamically significant stenosis (greater than 50%). Calcifications at the bifurcation and in the proximal left ICA and ECA are not hemodynamically significant. Vertebral arteries: Diminutive right vertebral artery, without evidence of arterial injury as it passes through the right C2 vertebral artery foramen. The right vertebral artery is patent to the vertebrobasilar junction without evidence of dissection, significant stenosis, or occlusion. Moderate narrowing at the origin of the left vertebral artery. The left vertebral artery is otherwise patent to the vertebrobasilar junction without evidence of dissection, significant stenosis, or occlusion. Skeleton: For cervical spine findings, please see same day CT cervical spine Other neck: Stranding and possible hematoma in the lower right sternocleidomastoid muscle surrounding soft tissues, likely posttraumatic. No active extravasation into the hematoma. Upper  chest: Please see same-day CT chest abdomen pelvis. IMPRESSION: 1. No evidence of vascular injury. Moderate narrowing at the origin of the left vertebral artery. No other hemodynamically significant stenosis in the neck. 2. For additional head and cervical spine findings, including known C2 fracture, please see same-day CT head and cervical spine. Electronically Signed   By: AMerilyn BabaM.D.   On: 09/05/2021 19:20   CT CERVICAL SPINE WO CONTRAST  Result Date: 09/05/2021 CLINICAL DATA:  Head trauma, minor (Age >= 65y); Neck trauma (Age >= 65y); Facial trauma, blunt EXAM: CT HEAD WITHOUT CONTRAST CT MAXILLOFACIAL WITHOUT CONTRAST CT CERVICAL SPINE WITHOUT CONTRAST TECHNIQUE: Multidetector CT imaging of the head, cervical spine, and maxillofacial structures were performed using the standard protocol without intravenous contrast. Multiplanar CT image reconstructions of the cervical spine and maxillofacial structures were also generated. RADIATION DOSE REDUCTION: This exam was performed according to the departmental dose-optimization program which includes automated exposure control, adjustment of the mA and/or kV according to patient size and/or use of iterative reconstruction technique. COMPARISON:  None Available. FINDINGS: CT HEAD FINDINGS Brain: Small (2 mm) area of hyperdensity along the anterior left falx (see series 4, 23) appears new from the prior and is concerning for acute subdural hemorrhage. No evidence of acute infarction, hydrocephalus, extra-axial collection or mass lesion/mass effect. Chronic microvascular ischemic disease and atrophy. Vascular: Calcific intracranial atherosclerosis. Skull: Large right forehead contusion without acute calvarial fracture. Other: No mastoid effusions. CT MAXILLOFACIAL FINDINGS Osseous: Significantly motion limited evaluation without obvious fracture. Evaluation of the inferior orbits and midface is particularly limited and nondiagnostic Orbits: Motion limited  without visible abnormality. Evaluation of  the orbits is nondiagnostic inferiorly. Sinuses: Clear. Soft tissues: Large right periorbital/forehead contusion. CT CERVICAL SPINE FINDINGS Alignment: Rotation of C1 on C2. Similar mild degenerative anterolisthesis of C5 on C6. Otherwise, no substantial sagittal subluxation Skull base and vertebrae: Mildly displaced fracture through the right lamina C2 (see series 4, image 17; series 7, image 27). Motion limits assessment with possible nondisplaced fracture extending into the right transverse foramen at C2 (series 4, image 23). Soft tissues and spinal canal: Motion limited without obvious prevertebral fluid or swelling. No visible canal hematoma. Disc levels: Similar multilevel severe degenerative disc disease with multilevel facet and uncovertebral hypertrophy. Resulting varying degrees of neural foraminal stenosis. Upper chest: Visualized lung apices are clear. IMPRESSION: CT cervical spine: 1. Mildly displaced fracture through the right lamina C2. 2. Motion limited study with possible additional fracture extending through the transverse foramen of C2 on the right. Given this finding, recommend CTA to assess for arterial injury. 3. Mild rotation of C1 on C2, which may be positional, but MRI could provide more sensitive evaluation for ligamentous injury if clinically warranted. CT head: 1. Small (2 mm) area of hyperdensity along the anterior left falx appears new from the prior and is concerning for acute subdural hemorrhage. 2. Large right forehead/periorbital contusion without visible acute calvarial fracture 3. Motion limited study. Maxillofacial CT: Significantly motion limited study without obvious evidence of acute fracture. Evaluation of the inferior orbits and midface is particularly limited and nondiagnostic. Given the large right periorbital/forehead contusion, consider follow-up maxillofacial CT to exclude fracture. Findings discussed with Dr. Rocky Crafts via  telephone at 4:04 p.m. Electronically Signed   By: Margaretha Sheffield M.D.   On: 09/05/2021 16:06   DG Pelvis Portable  Result Date: 09/05/2021 CLINICAL DATA:  Trauma. EXAM: PORTABLE PELVIS 1-2 VIEWS COMPARISON:  None Available. FINDINGS: There is no evidence of pelvic fracture or diastasis. No pelvic bone lesions are seen. IMPRESSION: Negative. Electronically Signed   By: Marijo Conception M.D.   On: 09/05/2021 14:26   CT CHEST ABDOMEN PELVIS W CONTRAST  Result Date: 09/05/2021 CLINICAL DATA:  Unwitnessed fall. EXAM: CT CHEST, ABDOMEN, AND PELVIS WITH CONTRAST TECHNIQUE: Multidetector CT imaging of the chest, abdomen and pelvis was performed following the standard protocol during bolus administration of intravenous contrast. RADIATION DOSE REDUCTION: This exam was performed according to the departmental dose-optimization program which includes automated exposure control, adjustment of the mA and/or kV according to patient size and/or use of iterative reconstruction technique. CONTRAST:  41m OMNIPAQUE IOHEXOL 300 MG/ML  SOLN COMPARISON:  None Available. FINDINGS: CT CHEST FINDINGS Cardiovascular: Cardiac enlargement. No pericardial effusion. Aortic atherosclerosis and coronary artery calcifications. Mediastinum/Nodes: Soft tissue stranding along the right lower neck is identified which may reflect sequelae of contusion, image 9/3. Thyroid gland, trachea, and esophagus are unremarkable. No enlarged axillary, supraclavicular, mediastinal, or hilar lymph nodes. Lungs/Pleura: Small to moderate right pleural effusion is identified. This is low in attenuation measuring 12 Hounsfield units, image 45/3. Trace left pleural effusion is also noted. Atelectasis and ground-glass attenuation overlying the right pleural effusion is identified. No pneumothorax or signs of contusion. No suspicious lung nodules. Musculoskeletal: No acute osseous findings. Remote left posterior rib fracture deformities appear healed. No acute  rib fractures identified bilaterally. CT ABDOMEN PELVIS FINDINGS Hepatobiliary: No hepatic injury or perihepatic hematoma. Status post cholecystectomy. Pancreas: Unremarkable. No pancreatic ductal dilatation or surrounding inflammatory changes. Spleen: No splenic injury or perisplenic hematoma. Adrenals/Urinary Tract: No adrenal hemorrhage or renal injury identified. Bilateral Bosniak class 1  kidney cysts. No follow-up recommended. Bladder is unremarkable. Stomach/Bowel: Stomach is within normal limits. Sigmoid diverticulosis without signs of acute diverticulitis. No evidence of bowel wall thickening, distention, or inflammatory changes. Vascular/Lymphatic: Aortic atherosclerosis without aneurysm. No signs of abdominopelvic adenopathy. Reproductive: Prostate gland enlargement. Other: No free fluid or fluid collections within the abdomen or pelvis. There is subcutaneous soft tissue stranding along the lower ventral pelvic wall centered at the level of the pubic symphysis. Small subcutaneous hematoma is noted measuring 1.3 x 2.2 cm. Soft tissue stranding extends towards the base of the penis. Findings may reflect contusion after fall. Musculoskeletal: There are acute fractures involving the right L1 and L2 transverse processes, image 68/3 and image 75/3. There is an age indeterminate compression fracture involving the T12 vertebra with loss of approximately 50% of the vertebral body height. Increased sclerosis within the vertebral body suggests a subacute or chronic fracture. IMPRESSION: 1. Acute fractures involving the right L1 and L2 transverse processes. 2. Subcutaneous soft tissue stranding along the lower ventral pelvic wall centered at the level of the pubic symphysis with small subcutaneous hematoma. Findings may reflect contusion secondary to fall. 3. Age indeterminate T12 compression fracture with loss of approximately 50% of the vertebral body height. 4. Subcutaneous soft tissue stranding is noted along the  right lower neck, which is concerning for soft tissue contusion. 5. Bilateral pleural effusions, right greater than left. 6. No signs of pulmonary contusion or pneumothorax. No evidence for solid organ injury within the abdomen or pelvis. 7. Aortic Atherosclerosis (ICD10-I70.0). Electronically Signed   By: Kerby Moors M.D.   On: 09/05/2021 16:04   DG Chest Port 1 View  Result Date: 09/05/2021 CLINICAL DATA:  Trauma in a 88 year old male. EXAM: PORTABLE CHEST 1 VIEW COMPARISON:  Comparison is made with examination from January 20, 2016. FINDINGS: Lung volumes are low. Heart size accentuated by portable technique, low lung volumes and AP projection. Mediastinal contours and hilar structures grossly stable accounting for these findings compared to previous imaging. No visible pneumothorax. Bibasilar airspace disease RIGHT greater than LEFT, somewhat indistinct on the RIGHT and partially obscuring RIGHT heart border. LEFT eighth and ninth rib fractures may be recent, not seen previously. Mildly indistinct appearance of the eighth rib fracture with more acute appearance of the ninth rib fracture, may be projectional in terms of the difference. IMPRESSION: 1. Low lung volumes with bibasilar airspace disease RIGHT greater than LEFT. Query aspiration or pneumonia on the RIGHT. 2. LEFT sided rib fractures appear mildly displaced and may be recent/acute. Electronically Signed   By: Zetta Bills M.D.   On: 09/05/2021 14:28   ECHOCARDIOGRAM COMPLETE  Result Date: 09/06/2021    ECHOCARDIOGRAM REPORT   Patient Name:   ORHAN MAYORGA Date of Exam: 09/06/2021 Medical Rec #:  [id number]  Height:       72.0 in Accession #:    [id number] Weight:       200.0 lb Date of Birth:  1920/12/26   BSA:          2.131 m Patient Age:    19 years   BP:           144/84 mmHg Patient Gender: M          HR:           77 bpm. Exam Location:  Inpatient Procedure: 2D Echo, Cardiac Doppler, Color Doppler and Intracardiac            Opacification  Agent Indications:  Syncope  History:        Patient has no prior history of Echocardiogram examinations.                 Arrythmias:Atrial Fibrillation; Risk Factors:Hypertension. CKD.  Sonographer:    Clayton Lefort RDCS (AE) Referring Phys: [id number] Royanne Foots [facility]  Sonographer Comments: Suboptimal subcostal window. Patient in cervical collar. IMPRESSIONS  1. Severe RV dysfunctin and elevated PA pressures consider r/o PE in setting of syncope.  2. Left ventricular ejection fraction, by estimation, is 60 to 65%. The left ventricle has normal function. The left ventricle has no regional wall motion abnormalities. Left ventricular diastolic parameters are indeterminate.  3. Right ventricular systolic function is severely reduced. The right ventricular size is severely enlarged.  4. Left atrial size was moderately dilated.  5. Right atrial size was moderately dilated.  6. The mitral valve is abnormal. Mild mitral valve regurgitation. No evidence of mitral stenosis.  7. Tricuspid valve regurgitation is severe.  8. The aortic valve is normal in structure. There is moderate calcification of the aortic valve. There is moderate thickening of the aortic valve. Aortic valve regurgitation is not visualized. Aortic valve sclerosis/calcification is present, without any  evidence of aortic stenosis.  9. Pulmonic valve regurgitation is moderate. 10. The inferior vena cava is normal in size with greater than 50% respiratory variability, suggesting right atrial pressure of 3 mmHg. FINDINGS  Left Ventricle: Left ventricular ejection fraction, by estimation, is 60 to 65%. The left ventricle has normal function. The left ventricle has no regional wall motion abnormalities. Definity contrast agent was given IV to delineate the left ventricular  endocardial borders. The left ventricular internal cavity size was normal in size. There is no left ventricular hypertrophy. Left ventricular diastolic parameters are indeterminate. Right Ventricle:  The right ventricular size is severely enlarged. Right vetricular wall thickness was not assessed. Right ventricular systolic function is severely reduced. Left Atrium: Left atrial size was moderately dilated. Right Atrium: Right atrial size was moderately dilated. Pericardium: There is no evidence of pericardial effusion. Mitral Valve: The mitral valve is abnormal. There is mild thickening of the mitral valve leaflet(s). There is mild calcification of the mitral valve leaflet(s). Mild mitral annular calcification. Mild mitral valve regurgitation. No evidence of mitral valve stenosis. Tricuspid Valve: The tricuspid valve is normal in structure. Tricuspid valve regurgitation is severe. No evidence of tricuspid stenosis. Aortic Valve: The aortic valve is normal in structure. There is moderate calcification of the aortic valve. There is moderate thickening of the aortic valve. Aortic valve regurgitation is not visualized. Aortic valve sclerosis/calcification is present, without any evidence of aortic stenosis. Aortic valve mean gradient measures 1.0 mmHg. Aortic valve peak gradient measures 2.5 mmHg. Aortic valve area, by VTI measures 2.07 cm. Pulmonic Valve: The pulmonic valve was normal in structure. Pulmonic valve regurgitation is moderate. No evidence of pulmonic stenosis. Aorta: The aortic root is normal in size and structure. Venous: The inferior vena cava is normal in size with greater than 50% respiratory variability, suggesting right atrial pressure of 3 mmHg. IAS/Shunts: No atrial level shunt detected by color flow Doppler. Additional Comments: Severe RV dysfunctin and elevated PA pressures consider r/o PE in setting of syncope.  LEFT VENTRICLE PLAX 2D LVIDd:         3.90 cm LVIDs:         2.60 cm LV PW:         1.20 cm LV IVS:  1.10 cm LVOT diam:     2.00 cm LV SV:         34 LV SV Index:   16 LVOT Area:     3.14 cm  RIGHT VENTRICLE RV S prime:     9.90 cm/s TAPSE (M-mode): 1.9 cm LEFT ATRIUM            Index        RIGHT ATRIUM           Index LA diam:      4.40 cm 2.07 cm/m   RA Area:     22.10 cm LA Vol (A2C): 48.9 ml 22.95 ml/m  RA Volume:   73.70 ml  34.59 ml/m LA Vol (A4C): 63.2 ml 29.66 ml/m  AORTIC VALVE AV Area (Vmax):    2.23 cm AV Area (Vmean):   2.16 cm AV Area (VTI):     2.07 cm AV Vmax:           79.30 cm/s AV Vmean:          55.900 cm/s AV VTI:            0.164 m AV Peak Grad:      2.5 mmHg AV Mean Grad:      1.0 mmHg LVOT Vmax:         56.20 cm/s LVOT Vmean:        38.400 cm/s LVOT VTI:          0.108 m LVOT/AV VTI ratio: 0.66  AORTA Ao Root diam: 3.10 cm Ao Asc diam:  3.30 cm TRICUSPID VALVE TR Peak grad:   29.6 mmHg TR Vmax:        272.00 cm/s  SHUNTS Systemic VTI:  0.11 m Systemic Diam: 2.00 cm Jenkins Rouge MD Electronically signed by Jenkins Rouge MD Signature Date/Time: 09/06/2021/11:19:38 AM    Final    CT MAXILLOFACIAL WO CONTRAST  Result Date: 09/05/2021 CLINICAL DATA:  Head trauma, minor (Age >= 65y); Neck trauma (Age >= 65y); Facial trauma, blunt EXAM: CT HEAD WITHOUT CONTRAST CT MAXILLOFACIAL WITHOUT CONTRAST CT CERVICAL SPINE WITHOUT CONTRAST TECHNIQUE: Multidetector CT imaging of the head, cervical spine, and maxillofacial structures were performed using the standard protocol without intravenous contrast. Multiplanar CT image reconstructions of the cervical spine and maxillofacial structures were also generated. RADIATION DOSE REDUCTION: This exam was performed according to the departmental dose-optimization program which includes automated exposure control, adjustment of the mA and/or kV according to patient size and/or use of iterative reconstruction technique. COMPARISON:  None Available. FINDINGS: CT HEAD FINDINGS Brain: Small (2 mm) area of hyperdensity along the anterior left falx (see series 4, 23) appears new from the prior and is concerning for acute subdural hemorrhage. No evidence of acute infarction, hydrocephalus, extra-axial collection or mass lesion/mass  effect. Chronic microvascular ischemic disease and atrophy. Vascular: Calcific intracranial atherosclerosis. Skull: Large right forehead contusion without acute calvarial fracture. Other: No mastoid effusions. CT MAXILLOFACIAL FINDINGS Osseous: Significantly motion limited evaluation without obvious fracture. Evaluation of the inferior orbits and midface is particularly limited and nondiagnostic Orbits: Motion limited without visible abnormality. Evaluation of the orbits is nondiagnostic inferiorly. Sinuses: Clear. Soft tissues: Large right periorbital/forehead contusion. CT CERVICAL SPINE FINDINGS Alignment: Rotation of C1 on C2. Similar mild degenerative anterolisthesis of C5 on C6. Otherwise, no substantial sagittal subluxation Skull base and vertebrae: Mildly displaced fracture through the right lamina C2 (see series 4, image 17; series 7, image 27). Motion limits assessment with possible nondisplaced fracture extending into  the right transverse foramen at C2 (series 4, image 23). Soft tissues and spinal canal: Motion limited without obvious prevertebral fluid or swelling. No visible canal hematoma. Disc levels: Similar multilevel severe degenerative disc disease with multilevel facet and uncovertebral hypertrophy. Resulting varying degrees of neural foraminal stenosis. Upper chest: Visualized lung apices are clear. IMPRESSION: CT cervical spine: 1. Mildly displaced fracture through the right lamina C2. 2. Motion limited study with possible additional fracture extending through the transverse foramen of C2 on the right. Given this finding, recommend CTA to assess for arterial injury. 3. Mild rotation of C1 on C2, which may be positional, but MRI could provide more sensitive evaluation for ligamentous injury if clinically warranted. CT head: 1. Small (2 mm) area of hyperdensity along the anterior left falx appears new from the prior and is concerning for acute subdural hemorrhage. 2. Large right  forehead/periorbital contusion without visible acute calvarial fracture 3. Motion limited study. Maxillofacial CT: Significantly motion limited study without obvious evidence of acute fracture. Evaluation of the inferior orbits and midface is particularly limited and nondiagnostic. Given the large right periorbital/forehead contusion, consider follow-up maxillofacial CT to exclude fracture. Findings discussed with Dr. Rocky Crafts via telephone at 4:04 p.m. Electronically Signed   By: Margaretha Sheffield M.D.   On: 09/05/2021 16:06    Microbiology: Results for orders placed or performed during the hospital encounter of 09/05/21  Culture, blood (routine x 2)     Status: None (Preliminary result)   Collection Time: 09/05/21  5:49 PM   Specimen: BLOOD  Result Value Ref Range Status   Specimen Description BLOOD BLOOD LEFT HAND  Final   Special Requests   Final    BOTTLES DRAWN AEROBIC AND ANAEROBIC Blood Culture adequate volume   Culture   Final    NO GROWTH 4 DAYS Performed at [facility], [place] [address] [place], [place] [address]    Report Status PENDING  Incomplete  Culture, blood (routine x 2)     Status: None (Preliminary result)   Collection Time: 09/05/21  8:21 PM   Specimen: BLOOD  Result Value Ref Range Status   Specimen Description BLOOD LEFT ANTECUBITAL  Final   Special Requests   Final    BOTTLES DRAWN AEROBIC AND ANAEROBIC Blood Culture results may not be optimal due to an inadequate volume of blood received in culture bottles   Culture   Final    NO GROWTH 4 DAYS Performed at [facility], [place] [address] [place], [place] [address]    Report Status PENDING  Incomplete    Labs: CBC: Recent Labs  Lab 09/05/21 1422 09/05/21 1453 09/06/21 0255 09/07/21 0214 09/08/21 0259  WBC 11.8*  --  11.7* 11.8* 13.3*  NEUTROABS  --   --   --   --  8.9*  HGB 11.4* 11.2* 10.5* 10.4* 10.4*  HCT 35.9* 33.0* 33.2* 33.7* 32.9*  MCV 104.7*  --  100.9* 103.1* 102.5*  PLT 238  --   256 250 778   Basic Metabolic Panel: Recent Labs  Lab 09/05/21 1422 09/05/21 1453 09/06/21 0255 09/07/21 0214 09/08/21 0259  NA 139 138 137 140 141  K 3.8 3.7 4.4 3.9 3.5  CL 105 104 103 107 107  CO2 23  --  '24 25 26  '$ GLUCOSE 188* 189* 126* 116* 99  BUN '21 22 20 '$ 24* 26*  CREATININE 1.34* 1.40* 1.37* 1.33* 1.32*  CALCIUM 9.3  --  9.1 8.8* 9.0  MG  --   --  1.8  --  1.8   Liver Function Tests: Recent Labs  Lab 09/05/21 1422 09/06/21 0255  AST 28 37  ALT 13 13  ALKPHOS 75 73  BILITOT 1.9* 2.1*  PROT 7.1 6.9  ALBUMIN 3.4* 3.2*   CBG: No results for input(s): GLUCAP in the last 168 hours.  Discharge time spent: less than 30 minutes.  Signed: Ezekiel Slocumb, DO [facility] 09/09/2021

## 2021-09-09 NOTE — Progress Notes (Signed)
Report called to [place] at [facility].

## 2021-09-09 NOTE — Progress Notes (Signed)
Patient ID:Jesse Benjamin      DOB: 05/29/1920      NLW:[id number]      Palliative Medicine Team    Subjective: Bedside symptom, patient receiving bed bath by NT at time of visit.    Physical exam: Patient resting in bed with eyes open at time of visit. Patient vocalizing some experienced pain with bath and movement.   Assessment and plan: This RN notified bedside RN of need for pain medications. NP Vinie Sill made aware of findings this morning. NP to round after bathing complete. Will continue to follow for any changes or advances.    Thank you for allowing the Palliative Medicine Team to assist in the care of this patient.     Damian Leavell, MSN, RN Palliative Medicine Team Team Phone: [phone]  This phone is monitored 7a-7p, please reach out to attending physician outside of these hours for urgent needs.

## 2021-09-10 LAB — CULTURE, BLOOD (ROUTINE X 2)
Culture: NO GROWTH
Culture: NO GROWTH
Special Requests: ADEQUATE

## 2021-10-11 DEATH — deceased

## 2023-06-12 IMAGING — CT CT MAXILLOFACIAL W/O CM
3 of 4 series · 14 of 47 positions shown, 16 images · non-contrast
Comparison: None Available.

CLINICAL DATA: Head trauma, minor (Age >= 65y); Neck trauma (Age >=
65y); Facial trauma, blunt



[Series 3: facial/ orbits 2.0 (person_name)30(person_name) (p · axial · 0.33mm/px · z∈[-228,-94]mm · 8 of 87 slices shown, 10 images]
[im 10/87  brain]
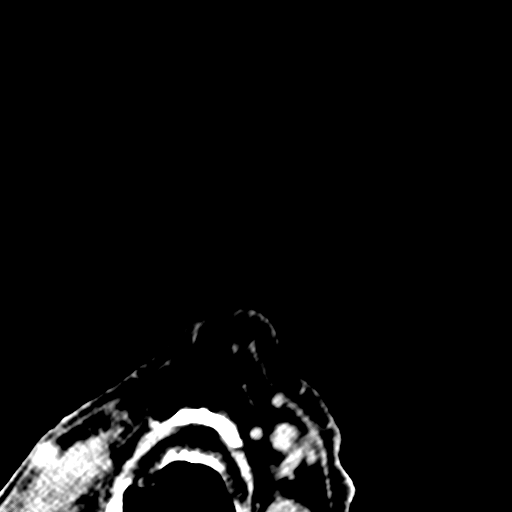
[im 10/87  bone]
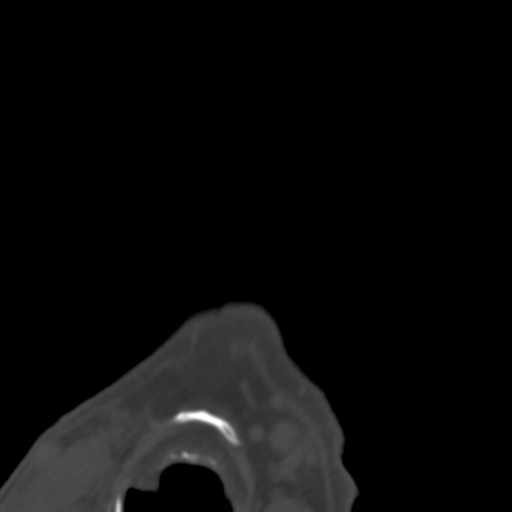
[im 20/87  bone]
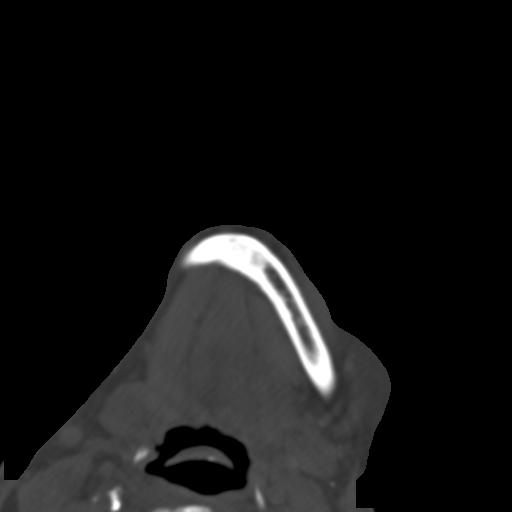
[im 29/87  bone]
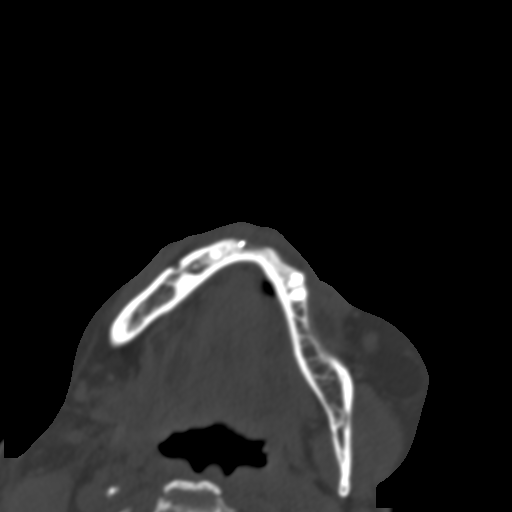
[im 39/87  bone]
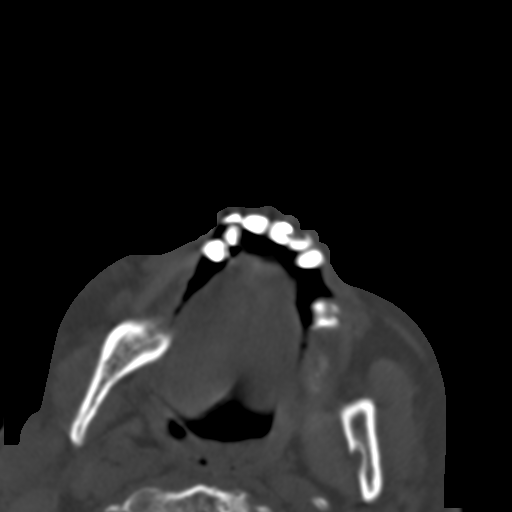
[im 48/87  brain]
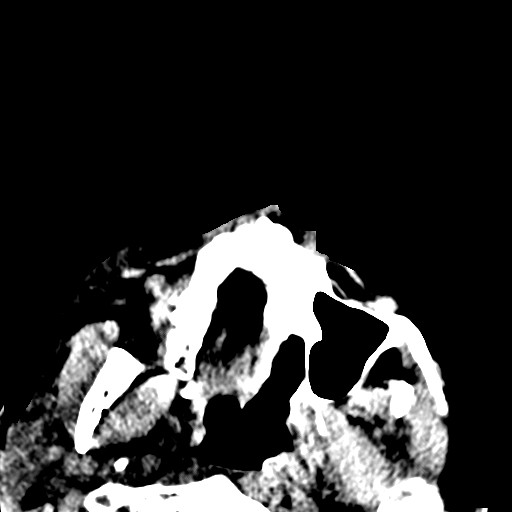
[im 48/87  bone]
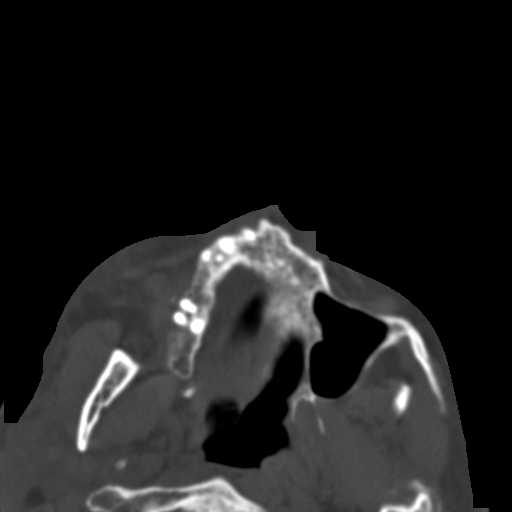
[im 58/87  bone]
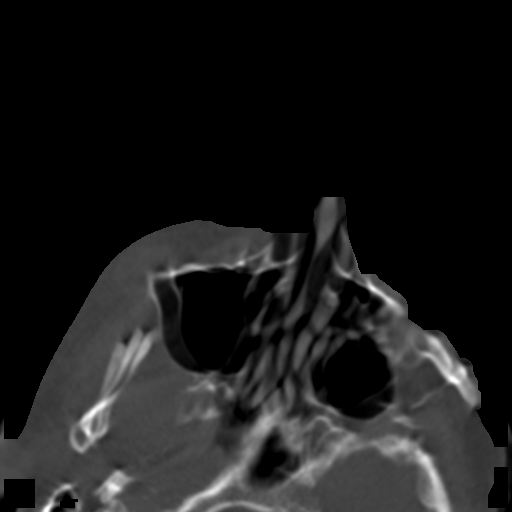
[im 67/87  bone]
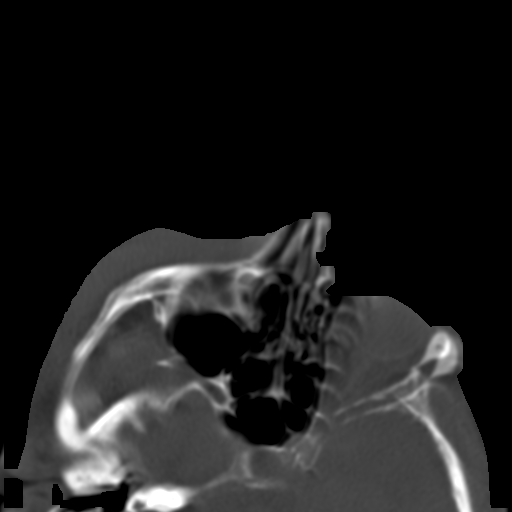
[im 77/87  bone]
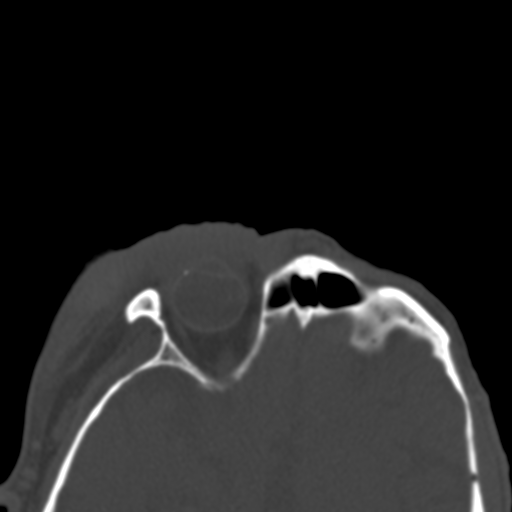

[Series 7: coronal soft tissue · coronal · 0.36mm/px · 3 of 88 slices shown]
[im 30/88  bone]
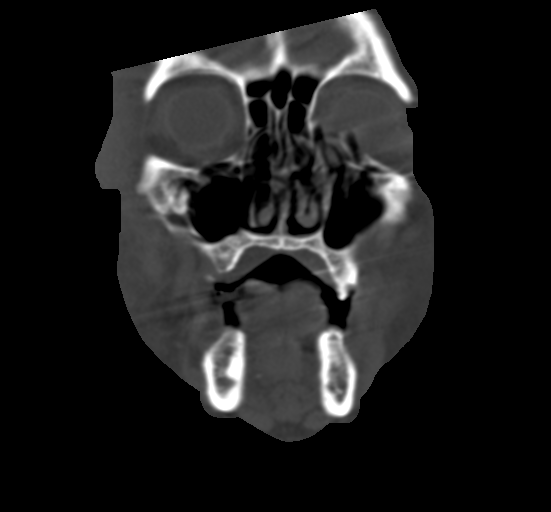
[im 39/88  bone]
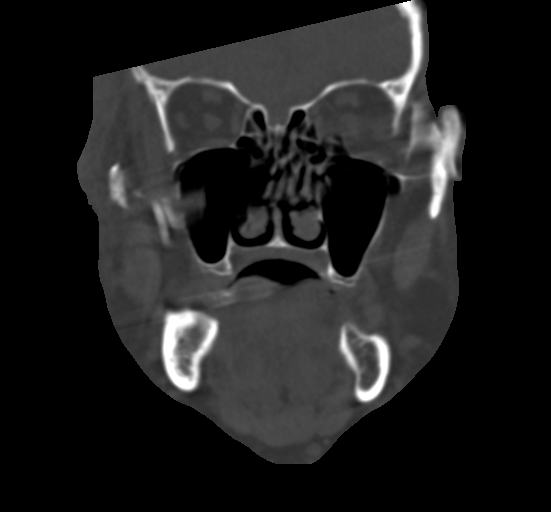
[im 49/88  bone]
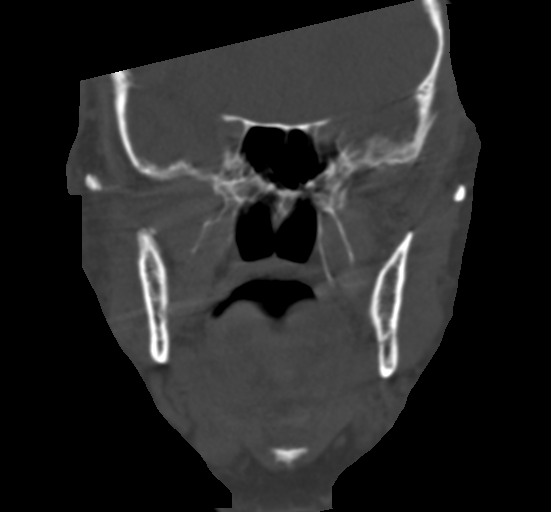

[Series 8: sagittal soft tissue · sagittal · 0.34mm/px · 3 of 95 slices shown]
[im 32/95  bone]
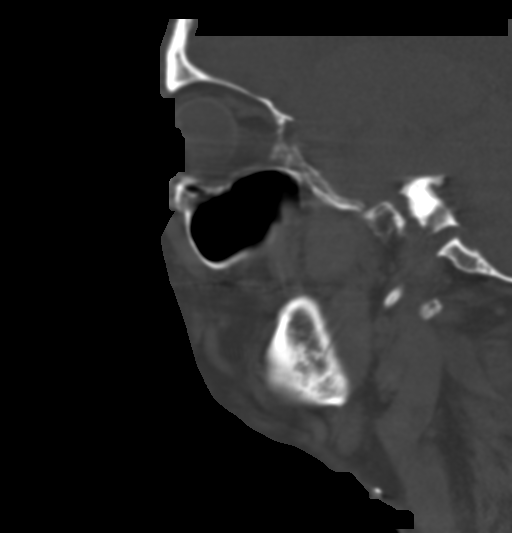
[im 48/95  bone]
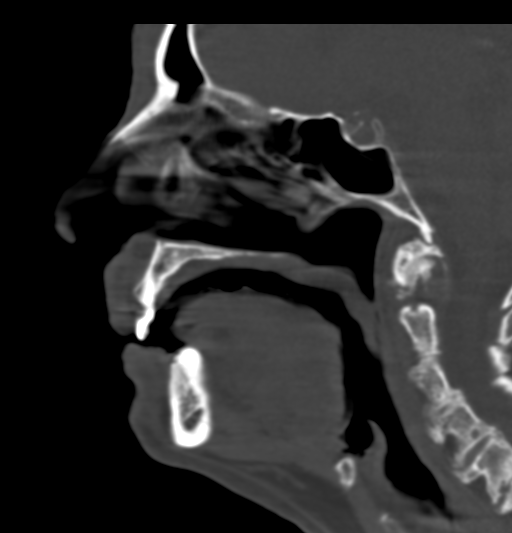
[im 63/95  bone]
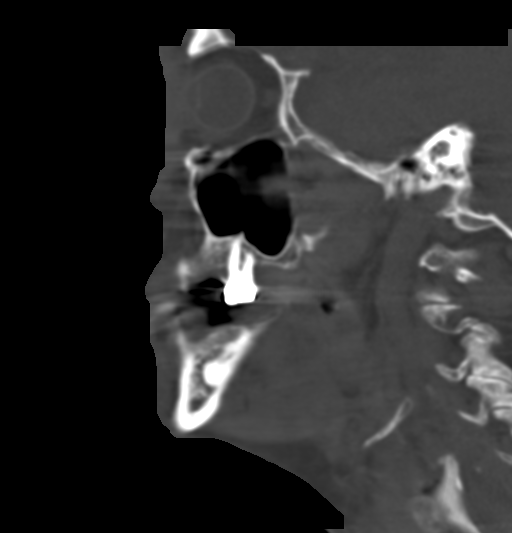

[14 of 47 positions shown; findings below may reference images not displayed]

FINDINGS: CT HEAD FINDINGS

Brain: Small (2 mm) area of hyperdensity along the anterior left
falx (see series [DATE]) appears new from the prior and is concerning
for acute subdural hemorrhage. No evidence of acute infarction,
hydrocephalus, extra-axial collection or mass lesion/mass effect.
Chronic microvascular ischemic disease and atrophy.

Vascular: Calcific intracranial atherosclerosis.

Skull: Large right forehead contusion without acute calvarial
fracture.

Other: No mastoid effusions.

CT MAXILLOFACIAL FINDINGS

Osseous: Significantly motion limited evaluation without obvious
fracture. Evaluation of the inferior orbits and midface is
particularly limited and nondiagnostic

Orbits: Motion limited without visible abnormality. Evaluation of
the orbits is nondiagnostic inferiorly.

Sinuses: Clear.

Soft tissues: Large right periorbital/forehead contusion.

CT CERVICAL SPINE FINDINGS

Alignment: Rotation of C1 on C2. Similar mild degenerative
anterolisthesis of C5 on C6. Otherwise, no substantial sagittal
subluxation

Skull base and vertebrae: Mildly displaced fracture through the
right lamina C2 (see series 4, image 17; series 7, image 27). Motion
limits assessment with possible nondisplaced fracture extending into
the right transverse foramen at C2 (series 4, image 23).

Soft tissues and spinal canal: Motion limited without obvious
prevertebral fluid or swelling. No visible canal hematoma.

Disc levels: Similar multilevel severe degenerative disc disease
with multilevel facet and uncovertebral hypertrophy. Resulting
varying degrees of neural foraminal stenosis.

Upper chest: Visualized lung apices are clear.
IMPRESSION: CT cervical spine:

1. Mildly displaced fracture through the right lamina C2.
2. Motion limited study with possible additional fracture extending
through the transverse foramen of C2 on the right. Given this
finding, recommend CTA to assess for arterial injury.
3. Mild rotation of C1 on C2, which may be positional, but MRI could
provide more sensitive evaluation for ligamentous injury if
clinically warranted.

CT head:

1. Small (2 mm) area of hyperdensity along the anterior left falx
appears new from the prior and is concerning for acute subdural
hemorrhage.
2. Large right forehead/periorbital contusion without visible acute
calvarial fracture
3. Motion limited study.

Maxillofacial CT:

Significantly motion limited study without obvious evidence of acute
fracture. Evaluation of the inferior orbits and midface is
particularly limited and nondiagnostic. Given the large right
periorbital/forehead contusion, consider follow-up maxillofacial CT
to exclude fracture.

Findings discussed with Dr. Damares via telephone at [DATE] p.m.
